# Patient Record
Sex: Male | Born: 1955 | Race: White | Hispanic: No | Marital: Married | Smoking: Current every day smoker
Health system: Southern US, Community
[De-identification: ages and names within clinical notes are randomized; demographics above are authoritative.]

## PROBLEM LIST (undated history)

## (undated) DIAGNOSIS — Z8619 Personal history of other infectious and parasitic diseases: Secondary | ICD-10-CM

## (undated) DIAGNOSIS — J449 Chronic obstructive pulmonary disease, unspecified: Secondary | ICD-10-CM

## (undated) HISTORY — PX: TONSILLECTOMY AND ADENOIDECTOMY: SHX28

## (undated) HISTORY — DX: Personal history of other infectious and parasitic diseases: Z86.19

---

## 1980-08-19 HISTORY — PX: EYE SURGERY: SHX253

## 1998-08-19 HISTORY — PX: NASAL SEPTUM SURGERY: SHX37

## 2005-11-27 ENCOUNTER — Emergency Department: Payer: Self-pay | Admitting: Emergency Medicine

## 2005-11-27 ENCOUNTER — Other Ambulatory Visit: Payer: Self-pay

## 2007-10-20 DIAGNOSIS — F17210 Nicotine dependence, cigarettes, uncomplicated: Secondary | ICD-10-CM | POA: Insufficient documentation

## 2008-09-15 DIAGNOSIS — E881 Lipodystrophy, not elsewhere classified: Secondary | ICD-10-CM | POA: Insufficient documentation

## 2008-09-17 DIAGNOSIS — Z8249 Family history of ischemic heart disease and other diseases of the circulatory system: Secondary | ICD-10-CM | POA: Insufficient documentation

## 2009-05-27 ENCOUNTER — Emergency Department: Payer: Self-pay | Admitting: Emergency Medicine

## 2009-06-13 ENCOUNTER — Emergency Department: Payer: Self-pay | Admitting: Emergency Medicine

## 2009-06-14 DIAGNOSIS — R55 Syncope and collapse: Secondary | ICD-10-CM | POA: Insufficient documentation

## 2009-09-25 DIAGNOSIS — R195 Other fecal abnormalities: Secondary | ICD-10-CM | POA: Insufficient documentation

## 2013-12-13 LAB — LIPID PANEL
Cholesterol: 172 mg/dL (ref 0–200)
HDL: 32 mg/dL — AB (ref 35–70)
LDL Cholesterol: 106 mg/dL
Triglycerides: 169 mg/dL — AB (ref 40–160)

## 2013-12-13 LAB — BASIC METABOLIC PANEL
BUN: 13 mg/dL (ref 4–21)
Creatinine: 1.2 mg/dL (ref 0.6–1.3)
Glucose: 99 mg/dL
Potassium: 4.4 mmol/L (ref 3.4–5.3)
Sodium: 141 mmol/L (ref 137–147)

## 2013-12-13 LAB — HEPATIC FUNCTION PANEL
ALT: 20 U/L (ref 10–40)
AST: 18 U/L (ref 14–40)

## 2013-12-13 LAB — PSA: PSA: 0.5

## 2015-01-28 ENCOUNTER — Other Ambulatory Visit: Payer: Self-pay | Admitting: Family Medicine

## 2015-01-28 NOTE — Telephone Encounter (Signed)
.  NOVS

## 2015-06-14 ENCOUNTER — Ambulatory Visit (INDEPENDENT_AMBULATORY_CARE_PROVIDER_SITE_OTHER): Payer: BLUE CROSS/BLUE SHIELD | Admitting: Family Medicine

## 2015-06-14 VITALS — BP 110/70 | HR 81 | Temp 98.7°F | Resp 16 | Ht 69.0 in | Wt 232.0 lb

## 2015-06-14 DIAGNOSIS — F17200 Nicotine dependence, unspecified, uncomplicated: Secondary | ICD-10-CM | POA: Diagnosis not present

## 2015-06-14 DIAGNOSIS — E881 Lipodystrophy, not elsewhere classified: Secondary | ICD-10-CM | POA: Diagnosis not present

## 2015-06-14 DIAGNOSIS — J342 Deviated nasal septum: Secondary | ICD-10-CM | POA: Insufficient documentation

## 2015-06-14 DIAGNOSIS — H5440 Blindness, one eye, unspecified eye: Secondary | ICD-10-CM | POA: Insufficient documentation

## 2015-06-14 DIAGNOSIS — Z0000 Encounter for general adult medical examination without abnormal findings: Secondary | ICD-10-CM

## 2015-06-14 MED ORDER — BUPROPION HCL ER (SMOKING DET) 150 MG PO TB12: ORAL_TABLET | ORAL | Status: DC

## 2015-06-14 NOTE — Progress Notes (Signed)
Patient: Jeffrey Scott, Male    DOB: [birth year], 59 y.o.   MRN: [id number] Visit Date: 06/14/2015  Today's Provider: Mila Merry, MD   Chief Complaint  Patient presents with  . Annual Exam  . Hyperlipidemia   Subjective:    Annual physical exam Jeffrey Scott is a 59 y.o. male who presents today for health maintenance and complete physical. He feels well. He reports exercising yes. He reports he is sleeping well.  -----------------------------------------------------------------    Lipid/Cholesterol, Follow-up:   Last seen for this 12/13/2013.  Management changes since that visit include none. . Last Lipid Panel:    Component Value Date/Time   CHOL 172 12/13/2013   TRIG 169* 12/13/2013   HDL 32* 12/13/2013   LDLCALC 106 12/13/2013    Risk factors for vascular disease include n/a  He reports good compliance with treatment. He is not having side effects.  Current symptoms include none and have been unchanged. Weight trend: stable Prior visit with dietician: no Current diet: well balanced Current exercise: walking  Wt Readings from Last 3 Encounters:  06/14/15 232 lb (105.235 kg)  12/13/13 230 lb (104.327 kg)    ------------------------------------------------------------------- Tobacco abuse: Has been trying without success to cut back on smoking. He did try Chantix i the past which he did not tolerate. He is interested in trying another medication.   Review of Systems  Constitutional: Negative.   HENT: Positive for congestion and tinnitus.   Eyes: Negative.   Respiratory: Negative.   Cardiovascular: Negative.   Gastrointestinal: Negative.   Endocrine: Negative.   Genitourinary: Negative.   Musculoskeletal: Negative.   Skin: Negative.   Allergic/Immunologic: Negative.   Neurological: Negative.   Hematological: Negative.   Psychiatric/Behavioral: Negative.     Social History He  reports that he has been smoking Cigarettes.  He  started smoking about 36 years ago. He has a 27 pack-year smoking history. He does not have any smokeless tobacco history on file. He reports that he drinks alcohol. He reports that he does not use illicit drugs. Social History   Social History  . Marital Status: Married    Spouse Name: N/A  . Number of Children: 2  . Years of Education: N/A   Occupational History  . Theatre stage manager    Social History Main Topics  . Smoking status: Current Every Day Smoker -- 0.75 packs/day for 36 years    Types: Cigarettes    Start date: 08/19/1978  . Smokeless tobacco: Not on file  . Alcohol Use: 0.0 oz/week    0 Standard drinks or equivalent per week     Comment: 1 beer each week  . Drug Use: No  . Sexual Activity: Not on file   Other Topics Concern  . Not on file   Social History Narrative  . No narrative on file    Patient Active Problem List   Diagnosis Date Noted  . 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 Deviated nasal septum 06/14/2015  . Abnormal findings in stool 09/25/2009  . Fam hx-ischem heart disease 09/17/2008  . 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 Compulsive tobacco user syndrome 10/20/2007    Past Surgical History  Procedure Laterality Date  . Nasal septum surgery  2000    [facility]  . Tonsillectomy and adenoidectomy    . Eye surgery Left 1982    for Diplopia    Family History  Family Status  Relation Status Death Age  . Mother Alive   .  Father Alive   . Brother Alive    His family history includes CAD in his mother; Dementia in his father; Diabetes in his father and mother; Hypertension in his father.    Allergies  Allergen Reactions  . Niaspan  [Niacin Er]   . Zyban  [Bupropion Hcl]   . Zyrtec  [Cetirizine]     malaise and fatigue    Previous Medications   ASPIRIN 81 MG TABLET    Take 1 tablet by mouth daily.   LOVASTATIN (MEVACOR) 20 MG TABLET    TAKE 1 TABLET BY MOUTH EVERY DAY   OMEGA-3 FATTY ACIDS (FISH OIL) 1000 MG CAPS    Take 1 capsule by mouth 2  (two) times daily.   OXYMETAZOLINE HCL-MENTHOL (AFRIN MENTHOL SPRAY) 0.05 % SOLN    as needed.    Patient Care Team: Malva Limes, MD as PCP - General (Family Medicine)     Objective:   Vitals: BP 110/70 mmHg  Pulse 81  Temp(Src) 98.7 F (37.1 C) (Oral)  Resp 16  Ht  (1.753 m)  Wt 232 lb (105.235 kg)  BMI 34.24 kg/m2  SpO2 94%   Physical Exam   General Appearance:    Alert, cooperative, no distress, appears stated age, obese  Head:    Normocephalic, without obvious abnormality, atraumatic  Eyes:    PERRL, conjunctiva/corneas clear, EOM's intact, fundi    benign, both eyes       Ears:    Normal TM's and external ear canals, both ears  Nose:   Nares normal, septum midline, mucosa normal, no drainage   or sinus tenderness  Throat:   Lips, mucosa, and tongue normal; teeth and gums normal  Neck:   Supple, symmetrical, trachea midline, no adenopathy;       thyroid:  No enlargement/tenderness/nodules; no carotid   bruit or JVD  Back:     Symmetric, no curvature, ROM normal, no CVA tenderness  Lungs:     Clear to auscultation bilaterally, respirations unlabored  Chest wall:    No tenderness or deformity  Heart:    Regular rate and rhythm, S1 and S2 normal, no murmur, rub   or gallop  Abdomen:     Soft, non-tender, bowel sounds active all four quadrants,    no masses, no organomegaly  Genitalia:    deferred  Rectal:    deferred  Extremities:   Extremities normal, atraumatic, no cyanosis or edema  Pulses:   2+ and symmetric all extremities  Skin:   Skin color, texture, turgor normal, no rashes or lesions  Lymph nodes:   Cervical, supraclavicular, and axillary nodes normal  Neurologic:   CNII-XII intact. Normal strength, sensation and reflexes      throughout    Depression Screen PHQ 2/9 Scores 06/14/2015  PHQ - 2 Score 0  PHQ- 9 Score 0      Assessment & Plan:     Routine Health Maintenance and Physical Exam  Exercise Activities and Dietary  recommendations Goals    None      Immunization History  Administered Date(s) Administered  . Influenza-Unspecified 04/20/2015  . Tdap 11/14/2010    Health Maintenance  Topic Date Due  . Hepatitis C Screening  Oct 22, 1955  . HIV Screening  06/29/1970  . TETANUS/TDAP  06/29/1974  . COLONOSCOPY  06/29/2005  . INFLUENZA VACCINE  03/20/2015      Discussed health benefits of physical activity, and encouraged him to engage in regular exercise appropriate for his age  and condition.    --------------------------------------------------------------------  1. Annual physical exam Unremarkable exam. Advised he needs colonoscopy. He refused referral but said he would call GI himself to schedule. I recommend he contact Dr. Annabell SabalWohl's office.  - PSA - Comprehensive metabolic panel  2. Lipodystrophy He is tolerating lovastatin well with no adverse effects.   - Lipid panel  3. Compulsive tobacco user syndrome Counseled on benefits of smoking. Given written information regarding recommendation for LDCT for lung cancer screening and advised to call to schedule if he decides to have this done.  - buPROPion (ZYBAN) 150 MG 12 hr tablet; One tablet every morning for 7 days, then one tablet twice daily. Stop smoking 14 days after starting medication  Dispense: 50 tablet; Refill: 3

## 2015-06-14 NOTE — Patient Instructions (Signed)
You are due for colonoscopy. I recommend [facility].

## 2015-06-15 ENCOUNTER — Other Ambulatory Visit: Payer: Self-pay

## 2015-06-15 ENCOUNTER — Telehealth: Payer: Self-pay

## 2015-06-15 ENCOUNTER — Other Ambulatory Visit: Payer: Self-pay | Admitting: Family Medicine

## 2015-06-15 LAB — COMPREHENSIVE METABOLIC PANEL
ALT: 26 IU/L (ref 0–44)
AST: 18 IU/L (ref 0–40)
Albumin/Globulin Ratio: 2 (ref 1.1–2.5)
Albumin: 4.7 g/dL (ref 3.5–5.5)
Alkaline Phosphatase: 70 IU/L (ref 39–117)
BUN/Creatinine Ratio: 11 (ref 9–20)
BUN: 14 mg/dL (ref 6–24)
Bilirubin Total: 0.4 mg/dL (ref 0.0–1.2)
CO2: 24 mmol/L (ref 18–29)
Calcium: 9.6 mg/dL (ref 8.7–10.2)
Chloride: 100 mmol/L (ref 97–106)
Creatinine, Ser: 1.33 mg/dL — ABNORMAL HIGH (ref 0.76–1.27)
GFR calc Af Amer: 67 mL/min/{1.73_m2} (ref >59)
GFR calc non Af Amer: 58 mL/min/{1.73_m2} — ABNORMAL LOW (ref >59)
Globulin, Total: 2.3 g/dL (ref 1.5–4.5)
Glucose: 96 mg/dL (ref 65–99)
Potassium: 4.6 mmol/L (ref 3.5–5.2)
Sodium: 140 mmol/L (ref 136–144)
Total Protein: 7 g/dL (ref 6.0–8.5)

## 2015-06-15 LAB — LIPID PANEL
Chol/HDL Ratio: 5.8 ratio units — ABNORMAL HIGH (ref 0.0–5.0)
Cholesterol, Total: 161 mg/dL (ref 100–199)
HDL: 28 mg/dL — ABNORMAL LOW (ref >39)
LDL Calculated: 98 mg/dL (ref 0–99)
Triglycerides: 173 mg/dL — ABNORMAL HIGH (ref 0–149)
VLDL Cholesterol Cal: 35 mg/dL (ref 5–40)

## 2015-06-15 LAB — PSA: Prostate Specific Ag, Serum: 0.5 ng/mL (ref 0.0–4.0)

## 2015-06-15 MED ORDER — LOVASTATIN 20 MG PO TABS: 20.0000 mg | ORAL_TABLET | Freq: Every day | ORAL | Status: DC

## 2015-06-15 NOTE — Telephone Encounter (Signed)
Patient was advised he is requesting that his script for Lovastatin be called into pharmacy. KW

## 2015-06-15 NOTE — Telephone Encounter (Signed)
[id number] 

## 2015-06-15 NOTE — Telephone Encounter (Signed)
Please see below-aa 

## 2015-06-15 NOTE — Telephone Encounter (Signed)
-----   Message from Malva Limesonald E Fisher, MD sent at 06/15/2015  8:03 AM EDT ----- PSA, blood sugar, kidney functions, electrolytes. Cholesterol pretty well controled at 161. Continue current medications. Check labs yearly.

## 2016-09-11 ENCOUNTER — Other Ambulatory Visit: Payer: Self-pay | Admitting: Family Medicine

## 2017-01-11 ENCOUNTER — Other Ambulatory Visit: Payer: Self-pay | Admitting: Family Medicine

## 2017-02-19 ENCOUNTER — Other Ambulatory Visit: Payer: Self-pay | Admitting: Family Medicine

## 2017-03-03 ENCOUNTER — Encounter: Payer: Self-pay | Admitting: Family Medicine

## 2017-03-03 ENCOUNTER — Ambulatory Visit (INDEPENDENT_AMBULATORY_CARE_PROVIDER_SITE_OTHER): Payer: BLUE CROSS/BLUE SHIELD | Admitting: Family Medicine

## 2017-03-03 VITALS — BP 132/78 | HR 80 | Temp 98.5°F | Resp 16 | Ht 70.0 in | Wt 234.5 lb

## 2017-03-03 DIAGNOSIS — Z1159 Encounter for screening for other viral diseases: Secondary | ICD-10-CM | POA: Diagnosis not present

## 2017-03-03 DIAGNOSIS — Z0000 Encounter for general adult medical examination without abnormal findings: Secondary | ICD-10-CM

## 2017-03-03 DIAGNOSIS — F17210 Nicotine dependence, cigarettes, uncomplicated: Secondary | ICD-10-CM

## 2017-03-03 DIAGNOSIS — E881 Lipodystrophy, not elsewhere classified: Secondary | ICD-10-CM | POA: Diagnosis not present

## 2017-03-03 DIAGNOSIS — R0609 Other forms of dyspnea: Secondary | ICD-10-CM | POA: Diagnosis not present

## 2017-03-03 DIAGNOSIS — J449 Chronic obstructive pulmonary disease, unspecified: Secondary | ICD-10-CM | POA: Insufficient documentation

## 2017-03-03 DIAGNOSIS — Z1211 Encounter for screening for malignant neoplasm of colon: Secondary | ICD-10-CM

## 2017-03-03 DIAGNOSIS — Z125 Encounter for screening for malignant neoplasm of prostate: Secondary | ICD-10-CM

## 2017-03-03 DIAGNOSIS — R0600 Dyspnea, unspecified: Secondary | ICD-10-CM

## 2017-03-03 DIAGNOSIS — Z6833 Body mass index (BMI) 33.0-33.9, adult: Secondary | ICD-10-CM | POA: Diagnosis not present

## 2017-03-03 MED ORDER — VARENICLINE TARTRATE 0.5 MG X 11 & 1 MG X 42 PO MISC: ORAL | 0 refills | Status: AC

## 2017-03-03 MED ORDER — VARENICLINE TARTRATE 1 MG PO TABS: 1.0000 mg | ORAL_TABLET | Freq: Two times a day (BID) | ORAL | 2 refills | Status: DC

## 2017-03-03 MED ORDER — TIOTROPIUM BROMIDE MONOHYDRATE 18 MCG IN CAPS: ORAL_CAPSULE | RESPIRATORY_TRACT | 12 refills | Status: DC

## 2017-03-03 NOTE — Progress Notes (Signed)
Patient: Jeffrey Scott, Male    DOB: [birth year], 61 y.o.   MRN: [id number] Visit Date: 03/03/2017  Today's Provider: Mila Merryonald Fisher, MD   Chief Complaint  Patient presents with  . Annual Exam  . Arm Pain  . Hyperlipidemia   Subjective:    Annual physical exam Jeffrey Scott is a 61 y.o. male who presents today for health maintenance and complete physical. He feels well. He reports exercising not regularly, but he does stay active. He reports he is sleeping well.  Colonoscopy- never Tdap- 11/14/2010.   Lipid/Cholesterol, Follow-up:   Last seen for this2 years ago.  Management changes since that visit include no changes. . Last Lipid Panel:    Component Value Date/Time   CHOL 161 06/14/2015 1059   TRIG 173 (H) 06/14/2015 1059   HDL 28 (L) 06/14/2015 1059   CHOLHDL 5.8 (H) 06/14/2015 1059   LDLCALC 98 06/14/2015 1059    Risk factors for vascular disease include smoking  He reports fair compliance with treatment. He is not having side effects.  Current symptoms include none and have been stable. Weight trend: stable Prior visit with dietician: no Current diet: well balanced Current exercise: no regular exercise  Wt Readings from Last 3 Encounters:  03/03/17 234 lb 8 oz (106.4 kg)  06/14/15 232 lb (105.2 kg)  12/13/13 230 lb (104.3 kg)    Arm pain: Patient reports that he has had pain in his left bicep for at least 6-8 months. He denies any injuries. He states the pain is occasionally relieved with Aleve. He reports that the pain is becoming more persistent, it mainly bothers him in the morning and he thinks he is just sleeping on it.   He also complains that he gets short of breath with exertion. Usually prolonged walking or going up more than two flights of stairs. No coughing or chest pain. He wants to stop smoking and states chantix worked well for him in the past.     Review of Systems  Constitutional: Negative.   HENT: Positive for  congestion, dental problem, postnasal drip and sinus pressure.   Eyes: Negative.   Respiratory: Positive for cough.   Cardiovascular: Negative.   Gastrointestinal: Negative.   Endocrine: Negative.   Genitourinary: Negative.   Musculoskeletal: Positive for arthralgias, back pain and myalgias.  Skin: Negative.   Allergic/Immunologic: Negative.   Neurological: Negative.   Hematological: Negative.   Psychiatric/Behavioral: Negative.     Social History      He  reports that he has been smoking Cigarettes.  He started smoking about 38 years ago. He has a 27.00 pack-year smoking history. He has never used smokeless tobacco. He reports that he drinks alcohol. He reports that he does not use drugs.       Social History   Social History  . Marital status: Married    Spouse name: N/A  . Number of children: 2  . Years of education: N/A   Occupational History  . Theatre stage managerlectrician and Plumber    Social History Main Topics  . Smoking status: Current Every Day Smoker    Packs/day: 0.75    Years: 50.00    Types: Cigarettes    Start date: 08/20/1967  . Smokeless tobacco: Never Used     Comment: smoking up to 1 ppd since age 61  . Alcohol use 0.0 oz/week     Comment: 1 beer each week  . Drug use: No  . Sexual activity:  Not Asked   Other Topics Concern  . None   Social History Narrative  . None    Past Medical History:  Diagnosis Date  . History of chicken pox   . History of measles      Patient Active Problem List   Diagnosis Date Noted  . 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 Deviated nasal septum 06/14/2015  . Abnormal findings in stool 09/25/2009  . Fam hx-ischem heart disease 09/17/2008  . 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 Compulsive tobacco user syndrome 10/20/2007    Past Surgical History:  Procedure Laterality Date  . EYE SURGERY Left 1982   for Diplopia  . NASAL SEPTUM SURGERY  2000   [facility]  . TONSILLECTOMY AND ADENOIDECTOMY      Family History        Family Status    Relation Status  . Mother Alive  . Father Deceased  . Brother Alive        His family history includes CAD in his mother; Dementia in his father; Diabetes in his father and mother; Hypertension in his father.     Allergies  Allergen Reactions  . Niaspan  [Niacin Er]   . Zyban  [Bupropion Hcl]   . Zyrtec  [Cetirizine]     malaise and fatigue     Current Outpatient Prescriptions:  .  aspirin 81 MG tablet, Take 1 tablet by mouth daily., Disp: , Rfl:  .  lovastatin (MEVACOR) 20 MG tablet, TAKE 1 TABLET BY MOUTH EVERY DAY, Disp: 30 tablet, Rfl: 2 .  Oxymetazoline HCl-Menthol (AFRIN MENTHOL SPRAY) 0.05 % SOLN, as needed., Disp: , Rfl:  .  buPROPion (ZYBAN) 150 MG 12 hr tablet, One tablet every morning for 7 days, then one tablet twice daily. Stop smoking 14 days after starting medication (Patient not taking: Reported on 03/03/2017), Disp: 50 tablet, Rfl: 3 .  Omega-3 Fatty Acids (FISH OIL) 1000 MG CAPS, Take 1 capsule by mouth 2 (two) times daily., Disp: , Rfl:    Patient Care Team: Malva Limes, MD as PCP - General (Family Medicine)      Objective:   Vitals: BP 132/78 (BP Location: Right Arm, Patient Position: Sitting, Cuff Size: Large)   Pulse 80   Temp 98.5 F (36.9 C)   Resp 16   Ht 5\' 10"  (1.778 m)   Wt 234 lb 8 oz (106.4 kg)   BMI 33.65 kg/m    Vitals:   03/03/17 1006  BP: 132/78  Pulse: 80  Resp: 16  Temp: 98.5 F (36.9 C)  Weight: 234 lb 8 oz (106.4 kg)  Height: 5\' 10"  (1.778 m)     Physical Exam   General Appearance:    Alert, cooperative, no distress, appears stated age, overweight  Head:    Normocephalic, without obvious abnormality, atraumatic  Eyes:    PERRL, conjunctiva/corneas clear, EOM's intact, fundi    benign, both eyes       Ears:    Normal TM's and external ear canals, both ears  Nose:   Nares normal, septum midline, mucosa normal, no drainage   or sinus tenderness  Throat:   Lips, mucosa, and tongue normal; teeth and gums normal  Neck:    Supple, symmetrical, trachea midline, no adenopathy;       thyroid:  No enlargement/tenderness/nodules; no carotid   bruit or JVD  Back:     Symmetric, no curvature, ROM normal, no CVA tenderness  Lungs:     Clear to auscultation bilaterally,  respirations unlabored  Chest wall:    No tenderness or deformity  Heart:    Regular rate and rhythm, S1 and S2 normal, no murmur, rub   or gallop  Abdomen:     Soft, non-tender, bowel sounds active all four quadrants,    no masses, no organomegaly  Genitalia:    deferred  Rectal:    deferred  Extremities:   Extremities normal, atraumatic, no cyanosis or edema  Pulses:   2+ and symmetric all extremities  Skin:   Skin color, texture, turgor normal, no rashes or lesions  Lymph nodes:   Cervical, supraclavicular, and axillary nodes normal  Neurologic:   CNII-XII intact. Normal strength, sensation and reflexes      throughout    Depression Screen PHQ 2/9 Scores 03/03/2017 06/14/2015  PHQ - 2 Score 0 0  PHQ- 9 Score 0 0      Assessment & Plan:     Routine Health Maintenance and Physical Exam  Exercise Activities and Dietary recommendations Goals    None      Immunization History  Administered Date(s) Administered  . Influenza-Unspecified 04/20/2015  . Tdap 11/14/2010    Health Maintenance  Topic Date Due  . Hepatitis C Screening  06-Oct-1955  . HIV Screening  06/29/1970  . COLONOSCOPY  06/29/2005  . INFLUENZA VACCINE  03/19/2017  . TETANUS/TDAP  11/13/2020     Discussed health benefits of physical activity, and encouraged him to engage in regular exercise appropriate for his age and condition.     1. Annual physical exam   2. BMI 33.0-33.9,adult Diet and exercise  3. Need for hepatitis C screening test  - Hepatitis C antibody  4. Lipodystrophy He is tolerating lovastatin well with no adverse effects.   - Comprehensive metabolic panel - Lipid panel  5. Smoking greater than 30 pack years  - varenicline (CHANTIX  STARTING MONTH PAK) 0.5 MG X 11 & 1 MG X 42 tablet; Take one 0.5 mg tablet daily for 3 days, then take one 0.5 mg tablet twice daily for 4 days, then take one 1 mg tablet twice daily  Dispense: 53 tablet; Refill: 0 - varenicline (CHANTIX CONTINUING MONTH PAK) 1 MG tablet; Take 1 tablet (1 mg total) by mouth 2 (two) times daily.  Dispense: 60 tablet; Refill: 2 - Ambulatory Referral for Lung Cancer Scre  6. Dyspnea on exertion  - Spirometry with Graph  7. Colon cancer screening  - Ambulatory referral to Gastroenterology  8. Prostate cancer screening  - PSA  9. Chronic obstructive pulmonary disease, unspecified COPD type (HCC) (mild on spirometry today) - tiotropium (SPIRIVA HANDIHALER) 18 MCG inhalation capsule; One capsule inhaler through device once a day  Dispense: 30 capsule; Refill: 12    Mila Merry, MD  [facility]

## 2017-03-03 NOTE — Patient Instructions (Signed)

## 2017-03-04 ENCOUNTER — Telehealth: Payer: Self-pay | Admitting: *Deleted

## 2017-03-04 LAB — COMPREHENSIVE METABOLIC PANEL
ALT: 18 IU/L (ref 0–44)
AST: 17 IU/L (ref 0–40)
Albumin/Globulin Ratio: 1.9 (ref 1.2–2.2)
Albumin: 4.3 g/dL (ref 3.6–4.8)
Alkaline Phosphatase: 62 IU/L (ref 39–117)
BUN/Creatinine Ratio: 9 — ABNORMAL LOW (ref 10–24)
BUN: 11 mg/dL (ref 8–27)
Bilirubin Total: 0.4 mg/dL (ref 0.0–1.2)
CO2: 23 mmol/L (ref 20–29)
Calcium: 9.3 mg/dL (ref 8.6–10.2)
Chloride: 106 mmol/L (ref 96–106)
Creatinine, Ser: 1.29 mg/dL — ABNORMAL HIGH (ref 0.76–1.27)
GFR calc Af Amer: 69 mL/min/{1.73_m2} (ref >59)
GFR calc non Af Amer: 59 mL/min/{1.73_m2} — ABNORMAL LOW (ref >59)
Globulin, Total: 2.3 g/dL (ref 1.5–4.5)
Glucose: 103 mg/dL — ABNORMAL HIGH (ref 65–99)
Potassium: 4.6 mmol/L (ref 3.5–5.2)
Sodium: 145 mmol/L — ABNORMAL HIGH (ref 134–144)
Total Protein: 6.6 g/dL (ref 6.0–8.5)

## 2017-03-04 LAB — LIPID PANEL
Chol/HDL Ratio: 5.5 ratio — ABNORMAL HIGH (ref 0.0–5.0)
Cholesterol, Total: 180 mg/dL (ref 100–199)
HDL: 33 mg/dL — ABNORMAL LOW (ref >39)
LDL Calculated: 114 mg/dL — ABNORMAL HIGH (ref 0–99)
Triglycerides: 163 mg/dL — ABNORMAL HIGH (ref 0–149)
VLDL Cholesterol Cal: 33 mg/dL (ref 5–40)

## 2017-03-04 LAB — PSA: Prostate Specific Ag, Serum: 0.6 ng/mL (ref 0.0–4.0)

## 2017-03-04 LAB — HEPATITIS C ANTIBODY: Hep C Virus Ab: <0.1 s/co ratio (ref 0.0–0.9)

## 2017-03-04 MED ORDER — LOVASTATIN 40 MG PO TABS: 40.0000 mg | ORAL_TABLET | Freq: Every day | ORAL | 11 refills | Status: DC

## 2017-03-04 NOTE — Telephone Encounter (Signed)
Patient was notified of results. Expressed understanding. Rx sent to pharmacy. 

## 2017-03-04 NOTE — Telephone Encounter (Signed)
-----   Message from Malva Limesonald E Fisher, MD sent at 03/04/2017 10:36 AM EDT ----- LDL cholesterol is up to 114, need to get below 100. Rest of labs normal .increase lovastatin to 40mg  one a day, #30, rf x 11.  Patient would like copy of labs mailed to his home please.

## 2017-03-05 ENCOUNTER — Telehealth: Payer: Self-pay | Admitting: *Deleted

## 2017-03-05 DIAGNOSIS — Z87891 Personal history of nicotine dependence: Secondary | ICD-10-CM

## 2017-03-05 NOTE — Telephone Encounter (Signed)
Received referral for initial lung cancer screening scan. Contacted patient and obtained smoking history,(current, 37.5 pack year) as well as answering questions related to screening process. Patient denies signs of lung cancer such as weight loss or hemoptysis. Patient denies comorbidity that would prevent curative treatment if lung cancer were found. Patient is scheduled for shared decision making visit and CT scan on 03/18/17.

## 2017-03-18 ENCOUNTER — Ambulatory Visit
Admission: RE | Admit: 2017-03-18 | Discharge: 2017-03-18 | Disposition: A | Discharge status: Home / Self Care | Payer: BLUE CROSS/BLUE SHIELD | Source: Ambulatory Visit | Attending: Oncology | Admitting: Oncology

## 2017-03-18 ENCOUNTER — Inpatient Hospital Stay: Payer: BLUE CROSS/BLUE SHIELD | Attending: Oncology | Admitting: Oncology

## 2017-03-18 DIAGNOSIS — Z87891 Personal history of nicotine dependence: Secondary | ICD-10-CM | POA: Diagnosis not present

## 2017-03-18 DIAGNOSIS — F17210 Nicotine dependence, cigarettes, uncomplicated: Secondary | ICD-10-CM | POA: Diagnosis not present

## 2017-03-18 DIAGNOSIS — Z122 Encounter for screening for malignant neoplasm of respiratory organs: Secondary | ICD-10-CM | POA: Diagnosis not present

## 2017-03-18 DIAGNOSIS — J439 Emphysema, unspecified: Secondary | ICD-10-CM | POA: Diagnosis not present

## 2017-03-18 NOTE — Progress Notes (Signed)
In accordance with CMS guidelines, patient has met eligibility criteria including age, absence of signs or symptoms of lung cancer.  Social History  Substance Use Topics  . Smoking status: Current Every Day Smoker    Packs/day: 0.75    Years: 50.00    Types: Cigarettes    Start date: 08/20/1967  . Smokeless tobacco: Never Used     Comment: smoking up to 1 ppd since age 61  . Alcohol use 0.0 oz/week     Comment: 1 beer each week     A shared decision-making session was conducted prior to the performance of CT scan. This includes one or more decision aids, includes benefits and harms of screening, follow-up diagnostic testing, over-diagnosis, false positive rate, and total radiation exposure.  Counseling on the importance of adherence to annual lung cancer LDCT screening, impact of co-morbidities, and ability or willingness to undergo diagnosis and treatment is imperative for compliance of the program.  Counseling on the importance of continued smoking cessation for former smokers; the importance of smoking cessation for current smokers, and information about tobacco cessation interventions have been given to patient including [place] and [facility] quit [place] programs.  Written order for lung cancer screening with LDCT has been given to the patient and any and all questions have been answered to the best of my abilities.   Yearly follow up will be coordinated by Burgess Estelle, Thoracic Navigator.  Faythe Casa, NP 03/18/2017 2:10 PM

## 2017-03-21 ENCOUNTER — Encounter: Payer: Self-pay | Admitting: *Deleted

## 2017-06-27 ENCOUNTER — Other Ambulatory Visit: Payer: Self-pay | Admitting: Family Medicine

## 2017-06-27 DIAGNOSIS — F17210 Nicotine dependence, cigarettes, uncomplicated: Secondary | ICD-10-CM

## 2017-06-27 MED ORDER — VARENICLINE TARTRATE 1 MG PO TABS: 1.0000 mg | ORAL_TABLET | Freq: Two times a day (BID) | ORAL | 2 refills | Status: DC

## 2017-06-27 NOTE — Telephone Encounter (Signed)
Requesting 90 day supply.

## 2017-06-27 NOTE — Telephone Encounter (Signed)
[facility] pharmacy faxed a request 90-days supply for the following medication. Thanks CC  varenicline (CHANTIX CONTINUING MONTH PAK) 1 MG tablet

## 2017-07-01 ENCOUNTER — Other Ambulatory Visit: Payer: Self-pay | Admitting: Family Medicine

## 2017-07-01 DIAGNOSIS — F17210 Nicotine dependence, cigarettes, uncomplicated: Secondary | ICD-10-CM

## 2017-07-01 MED ORDER — VARENICLINE TARTRATE 1 MG PO TABS: ORAL_TABLET | ORAL | 1 refills | Status: DC

## 2017-07-01 NOTE — Telephone Encounter (Signed)
[facility] faxed a request for a 90-days supply for the following medication. Thanks CC  CHANTIX 1 MG tablet

## 2018-03-03 ENCOUNTER — Other Ambulatory Visit: Payer: Self-pay | Admitting: Family Medicine

## 2018-03-05 ENCOUNTER — Telehealth: Payer: Self-pay | Admitting: Nurse Practitioner

## 2018-03-12 NOTE — Progress Notes (Signed)
Patient: Jeffrey Scott, Male    DOB: 1956-04-05, 62 y.o.   MRN: [id number] Visit Date: 03/13/2018  Today's Provider: Mila Merryonald Jefferson Fullam, MD   Chief Complaint  Patient presents with  . Annual Exam   Subjective:    Annual physical exam Jeffrey Scott is a 62 y.o. male who presents today for health maintenance and complete physical. He feels well. He reports no regular exercising . He reports he is sleeping well.  -----------------------------------------------------------------   Lipid/Cholesterol, Follow-up:   Last seen for this 1 years ago.  Management since that visit includes; labs checked. Increased lovastatin to 40 mg qd.  Last Lipid Panel:    Component Value Date/Time   CHOL 180 03/03/2017 1106   TRIG 163 (H) 03/03/2017 1106   HDL 33 (L) 03/03/2017 1106   CHOLHDL 5.5 (H) 03/03/2017 1106   LDLCALC 114 (H) 03/03/2017 1106    He reports good compliance with treatment. He is not having side effects.   Wt Readings from Last 3 Encounters:  03/13/18 247 lb 6.4 oz (112.2 kg)  03/18/17 225 lb (102.1 kg)  03/03/17 234 lb 8 oz (106.4 kg)    ------------------------------------------------------------------------  Compulsive Tobacco user syndrome From 03/03/2017-given rx for CHANTIX. Patient is requesting a refill. Patient states he went from smoking a carton of cigarettes every 5 days to 3 packs per week. States he and his wife plan on stopping completely this year. He feels like chantix has worked well and wants to continue.   Chronic obstructive pulmonary disease, unspecified COPD type (HCC) From 03/03/2017-spirometry obtained. [facility] Patient states symptoms of SOB is unchanged with Spiriva.    Review of Systems  Constitutional: Negative.   HENT: Negative.   Eyes: Negative.   Respiratory: Positive for cough, shortness of breath and wheezing.   Cardiovascular: Negative.   Gastrointestinal: Negative.   Endocrine: Negative.   Genitourinary:  Negative.   Musculoskeletal: Positive for arthralgias.  Skin: Negative.   Allergic/Immunologic: Negative.   Neurological: Negative.   Hematological: Bruises/bleeds easily.  Psychiatric/Behavioral: Negative.     Social History      He  reports that he has been smoking cigarettes.  He started smoking about 50 years ago. He has a 37.50 pack-year smoking history. He has never used smokeless tobacco. He reports that he drinks alcohol. He reports that he does not use drugs.       Social History   Socioeconomic History  . Marital status: Married    Spouse name: Not on file  . Number of children: 2  . Years of education: Not on file  . Highest education level: Not on file  Occupational History  . Occupation: Theatre stage managerlectrician and Plumber  Social Needs  . Financial resource strain: Not on file  . Food insecurity:    Worry: Not on file    Inability: Not on file  . Transportation needs:    Medical: Not on file    Non-medical: Not on file  Tobacco Use  . Smoking status: Current Every Day Smoker    Packs/day: 0.75    Years: 50.00    Pack years: 37.50    Types: Cigarettes    Start date: 08/20/1967  . Smokeless tobacco: Never Used  . Tobacco comment: smoking up to 1 ppd since age 62  Substance and Sexual Activity  . Alcohol use: Yes    Alcohol/week: 0.0 oz    Comment: 1 beer each week  . Drug use: No  . Sexual  activity: Not on file  Lifestyle  . Physical activity:    Days per week: Not on file    Minutes per session: Not on file  . Stress: Not on file  Relationships  . Social connections:    Talks on phone: Not on file    Gets together: Not on file    Attends religious service: Not on file    Active member of club or organization: Not on file    Attends meetings of clubs or organizations: Not on file    Relationship status: Not on file  Other Topics Concern  . Not on file  Social History Narrative  . Not on file    Past Medical History:  Diagnosis Date  . History of chicken  pox   . History of measles      Patient Active Problem List   Diagnosis Date Noted  . Chronic obstructive pulmonary disease (HCC) 03/03/2017  . 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 Deviated nasal septum 06/14/2015  . Abnormal findings in stool 09/25/2009  . Fam hx-ischem heart disease 09/17/2008  . 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 Compulsive tobacco user syndrome 10/20/2007    Past Surgical History:  Procedure Laterality Date  . EYE SURGERY Left 1982   for Diplopia  . NASAL SEPTUM SURGERY  2000   [facility]  . TONSILLECTOMY AND ADENOIDECTOMY      Family History        Family Status  Relation Name Status  . Mother  Alive  . Father  Deceased  . Brother  Alive        His family history includes CAD in his mother; Dementia in his father; Diabetes in his father and mother; Hypertension in his father and mother.      Allergies  Allergen Reactions  . Niaspan  [Niacin Er]   . Zyban  [Bupropion Hcl]   . Zyrtec  [Cetirizine]     malaise and fatigue     Current Outpatient Medications:  .  aspirin 81 MG tablet, Take 1 tablet by mouth daily., Disp: , Rfl:  .  lovastatin (MEVACOR) 40 MG tablet, TAKE 1 TABLET BY MOUTH EVERYDAY AT BEDTIME, Disp: 90 tablet, Rfl: 0 .  Oxymetazoline HCl-Menthol (AFRIN MENTHOL SPRAY) 0.05 % SOLN, as needed., Disp: , Rfl:  .  tiotropium (SPIRIVA HANDIHALER) 18 MCG inhalation capsule, One capsule inhaler through device once a day, Disp: 30 capsule, Rfl: 12 .  varenicline (CHANTIX) 1 MG tablet, TAKE 1 TABLET (1 MG TOTAL) BY MOUTH 2 (TWO) TIMES DAILY., Disp: 180 tablet, Rfl: 1   Patient Care Team: Malva Limes, MD as PCP - General (Family Medicine)      Objective:   Vitals: BP 138/88 (BP Location: Left Arm, Patient Position: Sitting, Cuff Size: Large)   Pulse 81   Temp 98.2 F (36.8 C) (Oral)   Ht 5\' 10"  (1.778 m)   Wt 247 lb 6.4 oz (112.2 kg)   SpO2 98%   BMI 35.50 kg/m    Vitals:   03/13/18 0900  BP: 138/88  Pulse: 81  Temp: 98.2 F (36.8  C)  TempSrc: Oral  SpO2: 98%  Weight: 247 lb 6.4 oz (112.2 kg)  Height: 5\' 10"  (1.778 m)     Physical Exam   General Appearance:    Alert, cooperative, no distress, appears stated age  Head:    Normocephalic, without obvious abnormality, atraumatic  Eyes:    PERRL, conjunctiva/corneas clear, EOM's intact, fundi    benign, both  eyes       Ears:    Normal TM's and external ear canals, both ears  Nose:   Nares normal, septum midline, mucosa normal, no drainage   or sinus tenderness  Throat:   Lips, mucosa, and tongue normal; teeth and gums normal  Neck:   Supple, symmetrical, trachea midline, no adenopathy;       thyroid:  No enlargement/tenderness/nodules; no carotid   bruit or JVD  Back:     Symmetric, no curvature, ROM normal, no CVA tenderness  Lungs:     Clear to auscultation bilaterally, respirations unlabored  Chest wall:    No tenderness or deformity  Heart:    Regular rate and rhythm, S1 and S2 normal, no murmur, rub   or gallop  Abdomen:     Soft, non-tender, bowel sounds active all four quadrants,    no masses, no organomegaly  Genitalia:    deferred  Rectal:    deferred  Extremities:   Extremities normal, atraumatic, no cyanosis or edema  Pulses:   2+ and symmetric all extremities  Skin:   Skin color, texture, turgor normal, no rashes or lesions  Lymph nodes:   Cervical, supraclavicular, and axillary nodes normal  Neurologic:   CNII-XII intact. Normal strength, sensation and reflexes      throughout    Depression Screen PHQ 2/9 Scores 03/13/2018 03/03/2017 06/14/2015  PHQ - 2 Score 0 0 0  PHQ- 9 Score 0 0 0    Spirometry: moderate obstruction.   Assessment & Plan:     Routine Health Maintenance and Physical Exam  Exercise Activities and Dietary recommendations Goals    None      Immunization History  Administered Date(s) Administered  . Influenza-Unspecified 04/20/2015  . Tdap 11/14/2010    Health Maintenance  Topic Date Due  . HIV Screening   06/29/1970  . COLONOSCOPY  06/29/2005  . INFLUENZA VACCINE  03/19/2018  . TETANUS/TDAP  11/13/2020  . Hepatitis C Screening  Completed     Discussed health benefits of physical activity, and encouraged him to engage in regular exercise appropriate for his age and condition.    --------------------------------------------------------------------  1. Annual physical exam  - HIV antibody - Lipid Profile - Comprehensive Metabolic Panel (CMET) - CBC with Differential - TSH - PSA  2. Lipodystrophy He is tolerating lovastatin well with no adverse effects.   - Lipid Profile - Comprehensive Metabolic Panel (CMET) - TSH  3. Chronic obstructive pulmonary disease, unspecified COPD type (HCC) Worsening with further reduction of FVC and FEV1 on spirometry today. Counseled on importance of smoking cessation. Change Spiriva to Stiolto.   4. . Smoking greater than 30 pack years Down to 3 1/2 pack per week. Counseled to stop completely as soon as able, continue- varenicline (CHANTIX) 1 MG tablet; TAKE 1 TABLET (1 MG TOTAL) BY MOUTH 2 (TWO) TIMES DAILY.  Dispense: 180 tablet; Refill: 3 - Spirometry with Graph  5. Colon cancer screening He states he plans on having this done after his mother and wife have upcoming medical procedures done.   6. Prostate cancer screening -PSA  7. Dyspnea, unspecified type Secondary to COPD, change inhalers as above.  - CBC with Differential - Spirometry with Graph  8. Encounter for screening for HIV  - HIV antibody  9.. BMI 35.0-35.9,adult Walks 9-11 thousand steps a day for work. Cut back on portion sizes.  - TSH   Mila Merry, MD  [facility]

## 2018-03-13 ENCOUNTER — Encounter: Payer: Self-pay | Admitting: Family Medicine

## 2018-03-13 ENCOUNTER — Ambulatory Visit (INDEPENDENT_AMBULATORY_CARE_PROVIDER_SITE_OTHER): Payer: BLUE CROSS/BLUE SHIELD | Admitting: Family Medicine

## 2018-03-13 VITALS — BP 138/88 | HR 81 | Temp 98.2°F | Ht 70.0 in | Wt 247.4 lb

## 2018-03-13 DIAGNOSIS — Z114 Encounter for screening for human immunodeficiency virus [HIV]: Secondary | ICD-10-CM

## 2018-03-13 DIAGNOSIS — Z6835 Body mass index (BMI) 35.0-35.9, adult: Secondary | ICD-10-CM

## 2018-03-13 DIAGNOSIS — E881 Lipodystrophy, not elsewhere classified: Secondary | ICD-10-CM

## 2018-03-13 DIAGNOSIS — Z1211 Encounter for screening for malignant neoplasm of colon: Secondary | ICD-10-CM

## 2018-03-13 DIAGNOSIS — F17200 Nicotine dependence, unspecified, uncomplicated: Secondary | ICD-10-CM | POA: Diagnosis not present

## 2018-03-13 DIAGNOSIS — J449 Chronic obstructive pulmonary disease, unspecified: Secondary | ICD-10-CM | POA: Diagnosis not present

## 2018-03-13 DIAGNOSIS — R0600 Dyspnea, unspecified: Secondary | ICD-10-CM

## 2018-03-13 DIAGNOSIS — Z125 Encounter for screening for malignant neoplasm of prostate: Secondary | ICD-10-CM

## 2018-03-13 DIAGNOSIS — Z0000 Encounter for general adult medical examination without abnormal findings: Secondary | ICD-10-CM

## 2018-03-13 DIAGNOSIS — F17210 Nicotine dependence, cigarettes, uncomplicated: Secondary | ICD-10-CM

## 2018-03-13 DIAGNOSIS — Z0001 Encounter for general adult medical examination with abnormal findings: Secondary | ICD-10-CM

## 2018-03-13 MED ORDER — VARENICLINE TARTRATE 1 MG PO TABS: ORAL_TABLET | ORAL | 3 refills | Status: DC

## 2018-03-13 MED ORDER — TIOTROPIUM BROMIDE-OLODATEROL 2.5-2.5 MCG/ACT IN AERS: 2.0000 | INHALATION_SPRAY | Freq: Every day | RESPIRATORY_TRACT | 12 refills | Status: DC

## 2018-03-13 NOTE — Patient Instructions (Addendum)
   Stop smoking before the new year begins   Call for referral to GI for colonoscopy in the next couple of months.    Screening for lung cancer is recommended for people between 4255 and 880 years of age who have smoked the equivalent of 1 pack per day for 30 years. You are due for this screening in August 2019 and should get a call from [facility] to schedule.

## 2018-03-14 LAB — CBC WITH DIFFERENTIAL/PLATELET
Basophils Absolute: 0 10*3/uL (ref 0.0–0.2)
Basos: 0 %
EOS (ABSOLUTE): 0.2 10*3/uL (ref 0.0–0.4)
Eos: 2 %
Hematocrit: 41.5 % (ref 37.5–51.0)
Hemoglobin: 14.5 g/dL (ref 13.0–17.7)
Immature Grans (Abs): 0 10*3/uL (ref 0.0–0.1)
Immature Granulocytes: 0 %
Lymphocytes Absolute: 2.1 10*3/uL (ref 0.7–3.1)
Lymphs: 26 %
MCH: 30.9 pg (ref 26.6–33.0)
MCHC: 34.9 g/dL (ref 31.5–35.7)
MCV: 89 fL (ref 79–97)
Monocytes Absolute: 0.6 10*3/uL (ref 0.1–0.9)
Monocytes: 7 %
Neutrophils Absolute: 5.1 10*3/uL (ref 1.4–7.0)
Neutrophils: 65 %
Platelets: 210 10*3/uL (ref 150–450)
RBC: 4.69 x10E6/uL (ref 4.14–5.80)
RDW: 14.7 % (ref 12.3–15.4)
WBC: 8.1 10*3/uL (ref 3.4–10.8)

## 2018-03-14 LAB — COMPREHENSIVE METABOLIC PANEL
ALT: 35 IU/L (ref 0–44)
AST: 24 IU/L (ref 0–40)
Albumin/Globulin Ratio: 2.1 (ref 1.2–2.2)
Albumin: 4.5 g/dL (ref 3.6–4.8)
Alkaline Phosphatase: 55 IU/L (ref 39–117)
BUN/Creatinine Ratio: 13 (ref 10–24)
BUN: 18 mg/dL (ref 8–27)
Bilirubin Total: 0.4 mg/dL (ref 0.0–1.2)
CO2: 23 mmol/L (ref 20–29)
Calcium: 9.5 mg/dL (ref 8.6–10.2)
Chloride: 103 mmol/L (ref 96–106)
Creatinine, Ser: 1.42 mg/dL — ABNORMAL HIGH (ref 0.76–1.27)
GFR calc Af Amer: 61 mL/min/{1.73_m2} (ref >59)
GFR calc non Af Amer: 53 mL/min/{1.73_m2} — ABNORMAL LOW (ref >59)
Globulin, Total: 2.1 g/dL (ref 1.5–4.5)
Glucose: 106 mg/dL — ABNORMAL HIGH (ref 65–99)
Potassium: 4.3 mmol/L (ref 3.5–5.2)
Sodium: 140 mmol/L (ref 134–144)
Total Protein: 6.6 g/dL (ref 6.0–8.5)

## 2018-03-14 LAB — TSH: TSH: 1.23 u[IU]/mL (ref 0.450–4.500)

## 2018-03-14 LAB — HIV ANTIBODY (ROUTINE TESTING W REFLEX): HIV Screen 4th Generation wRfx: NONREACTIVE

## 2018-03-14 LAB — LIPID PANEL
Chol/HDL Ratio: 5.1 ratio — ABNORMAL HIGH (ref 0.0–5.0)
Cholesterol, Total: 162 mg/dL (ref 100–199)
HDL: 32 mg/dL — ABNORMAL LOW (ref >39)
LDL Calculated: 89 mg/dL (ref 0–99)
Triglycerides: 207 mg/dL — ABNORMAL HIGH (ref 0–149)
VLDL Cholesterol Cal: 41 mg/dL — ABNORMAL HIGH (ref 5–40)

## 2018-03-14 LAB — PSA: Prostate Specific Ag, Serum: 0.6 ng/mL (ref 0.0–4.0)

## 2018-03-16 ENCOUNTER — Telehealth: Payer: Self-pay

## 2018-03-16 NOTE — Telephone Encounter (Signed)
-----   Message from Malva Limesonald E Fisher, MD sent at 03/14/2018  6:31 PM EDT ----- Cholesterol is well controlled at 162 rest of labs are normal. Continue current medications.  Check yearly

## 2018-03-16 NOTE — Telephone Encounter (Signed)
Attempted to contact patient. No answer nor voicemail.  

## 2018-03-18 NOTE — Telephone Encounter (Signed)
Patient advised.

## 2018-04-02 ENCOUNTER — Other Ambulatory Visit: Payer: Self-pay | Admitting: Family Medicine

## 2018-04-02 DIAGNOSIS — J449 Chronic obstructive pulmonary disease, unspecified: Secondary | ICD-10-CM

## 2018-05-31 ENCOUNTER — Other Ambulatory Visit: Payer: Self-pay | Admitting: Family Medicine

## 2018-08-28 IMAGING — CT CT CHEST LUNG CANCER SCREENING LOW DOSE W/O CM
1 of 2 series · 15 of 40 positions shown, 19 images · non-contrast
Comparison: None.

CLINICAL DATA: 60-year-old male with day 38 pack-year history of
smoking. Lung cancer screening.

EXAM:
CT CHEST WITHOUT CONTRAST LOW-DOSE FOR LUNG CANCER SCREENING
TECHNIQUE: Multidetector CT imaging of the chest was performed following the
standard protocol without IV contrast.

[Series 2: axial st · axial · 0.79mm/px · z∈[-573,-288]mm · 15 of 63 slices shown, 19 images]
[im 3/63  mediastinal]
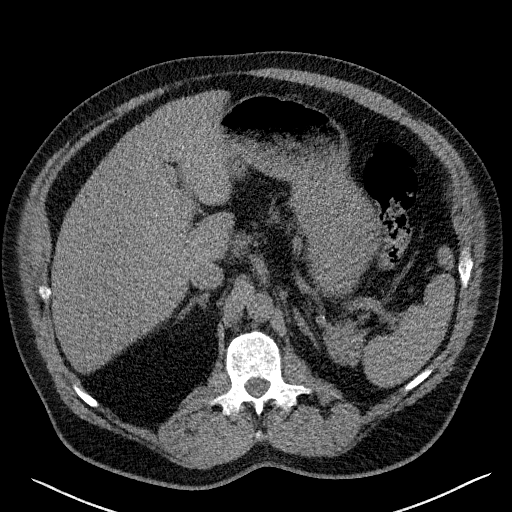
[im 3/63  lung]
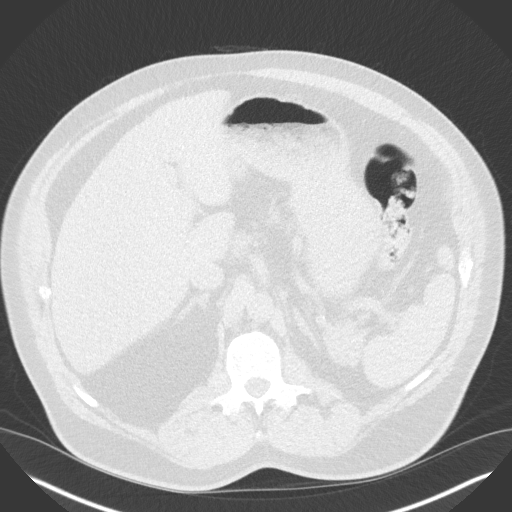
[im 8/63  lung]
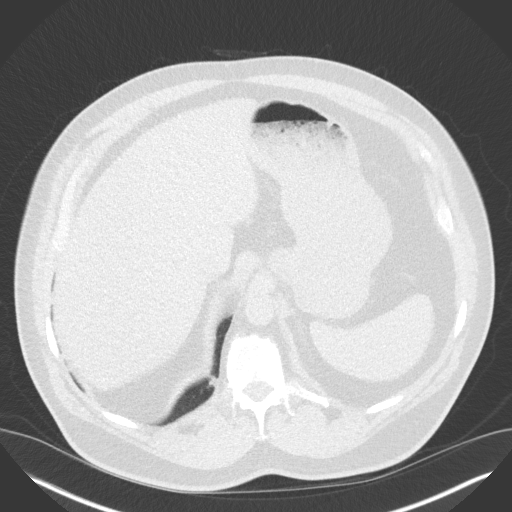
[im 12/63  lung]
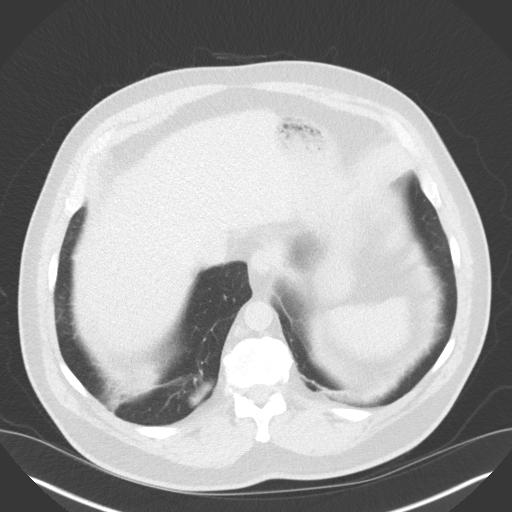
[im 16/63  lung]
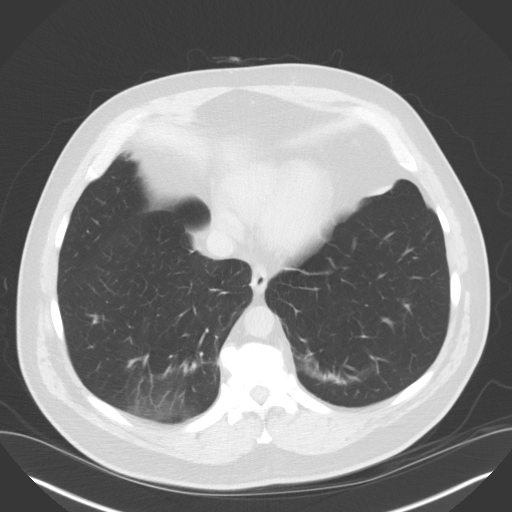
[im 20/63  mediastinal]
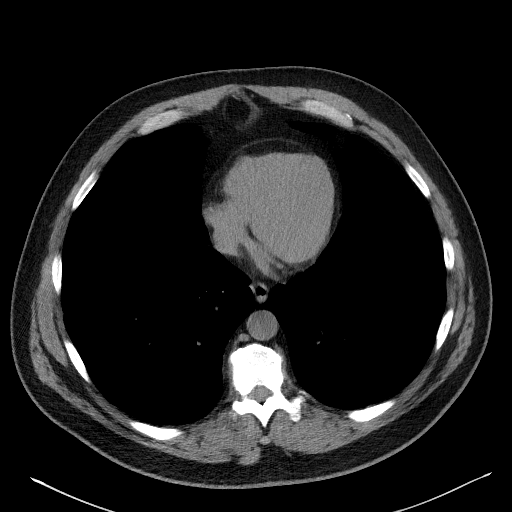
[im 20/63  lung]
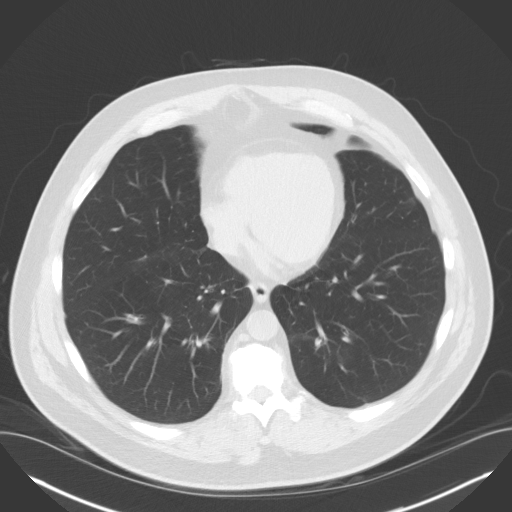
[im 24/63  lung]
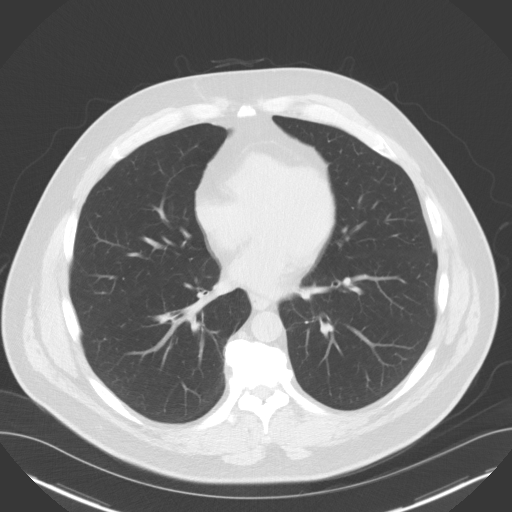
[im 29/63  lung]
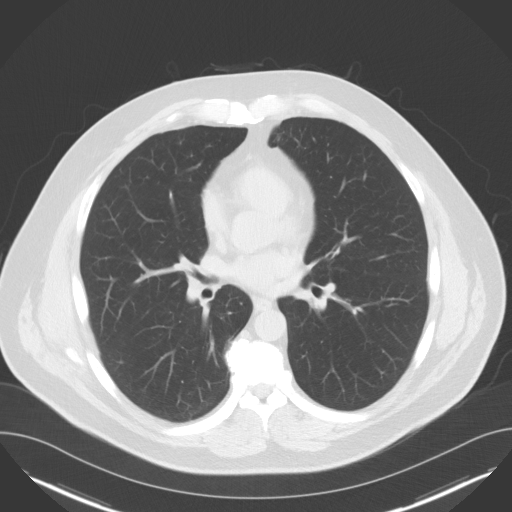
[im 32/63  lung]
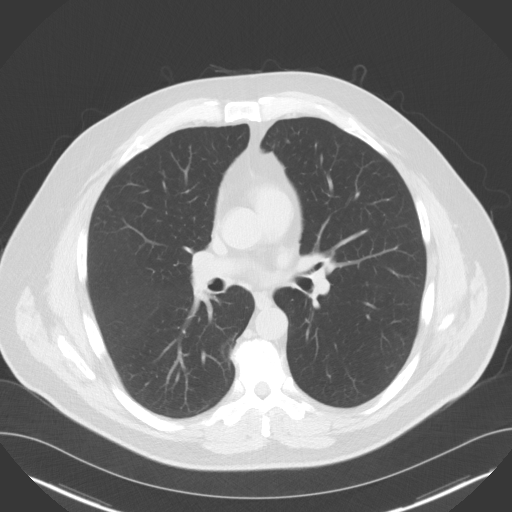
[im 34/63  mediastinal]
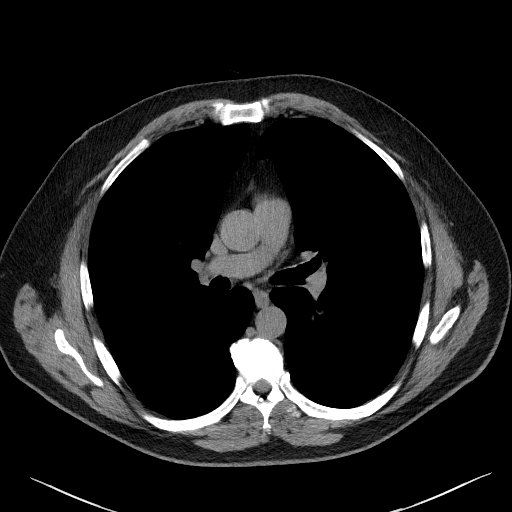
[im 34/63  lung]
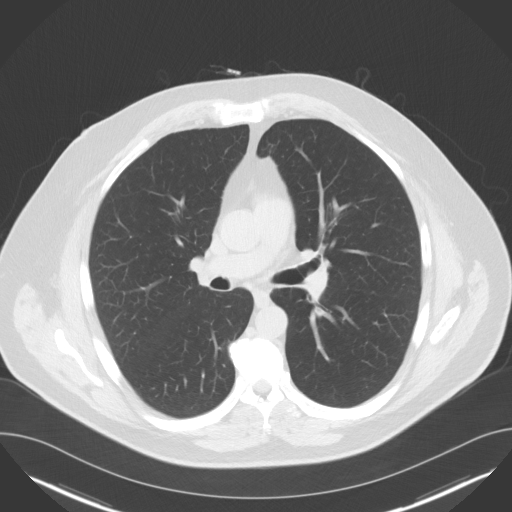
[im 39/63  lung]
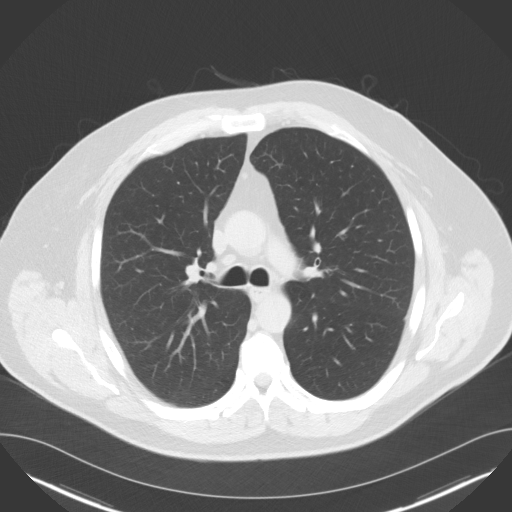
[im 43/63  lung]
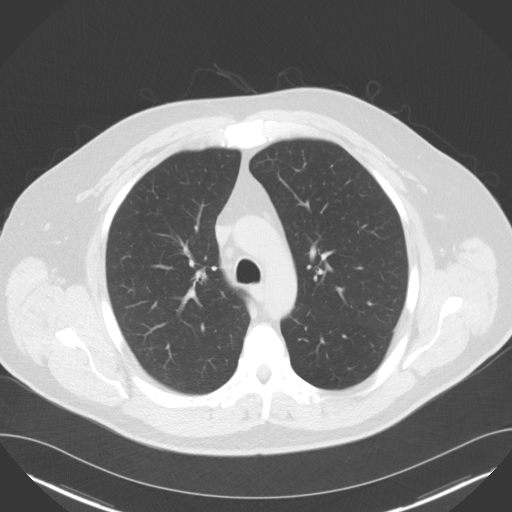
[im 47/63  lung]
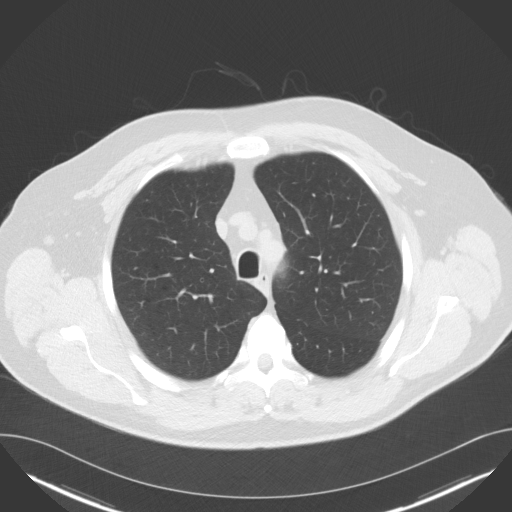
[im 51/63  mediastinal]
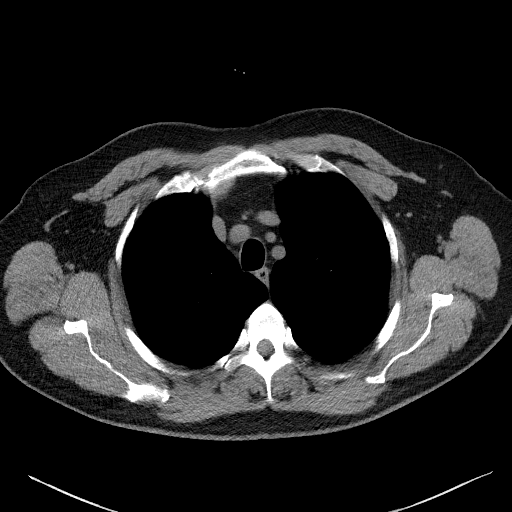
[im 51/63  lung]
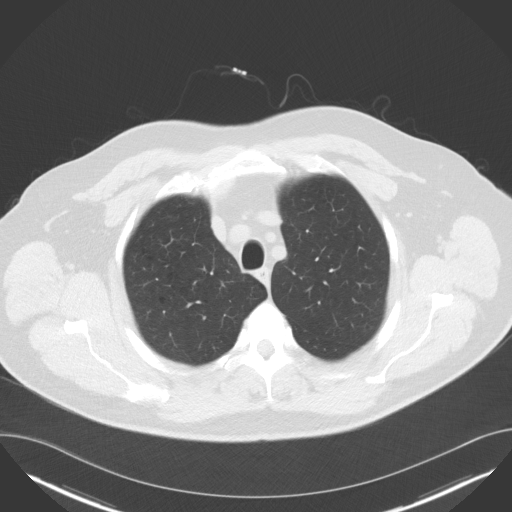
[im 55/63  lung]
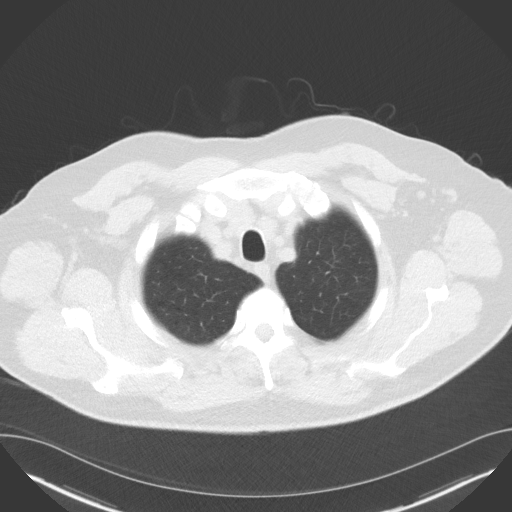
[im 60/63  lung]
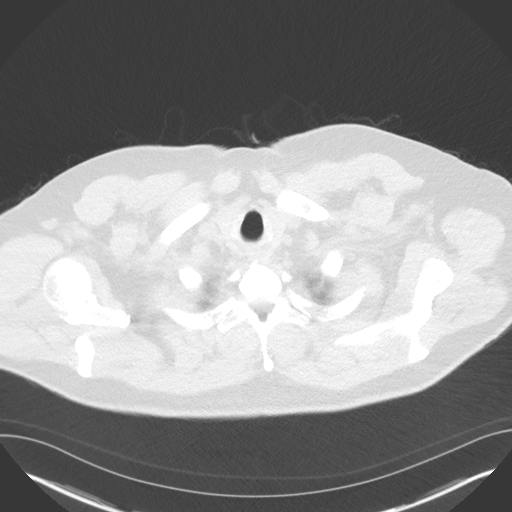

[15 of 40 positions shown; findings below may reference images not displayed]

FINDINGS: Cardiovascular: The heart size is normal. No pericardial effusion.
No thoracic aortic aneurysm.

Mediastinum/Nodes: No mediastinal lymphadenopathy. No evidence for
gross hilar lymphadenopathy although assessment is limited by the
lack of intravenous contrast on today's study. The esophagus has
normal imaging features. There is no axillary lymphadenopathy.

Lungs/Pleura: Centrilobular emphysema evident. Small nodule
identified central right lower lobe with volume derived mean
diameter of 5.2 mm. Subsegmental atelectasis or linear scarring
noted in both lung bases. No suspicious pulmonary nodule or mass. No
focal airspace consolidation. No pulmonary edema or pleural
effusion.

Upper Abdomen: Unremarkable.

Musculoskeletal: Bone windows reveal no worrisome lytic or sclerotic
osseous lesions.
IMPRESSION: 1. Lung-RADS 2, benign appearance or behavior. Continue annual
screening with low-dose chest CT without contrast in 12 months.
2.  Emphysema. ([id number])

## 2019-04-21 ENCOUNTER — Other Ambulatory Visit: Payer: Self-pay | Admitting: Family Medicine

## 2019-04-21 DIAGNOSIS — F17200 Nicotine dependence, unspecified, uncomplicated: Secondary | ICD-10-CM

## 2019-04-21 DIAGNOSIS — F17210 Nicotine dependence, cigarettes, uncomplicated: Secondary | ICD-10-CM

## 2019-07-05 ENCOUNTER — Other Ambulatory Visit: Payer: Self-pay | Admitting: Family Medicine

## 2019-07-05 NOTE — Telephone Encounter (Signed)
From [facility], please review 

## 2019-07-05 NOTE — Telephone Encounter (Signed)
Pt has appt 08/09/19 for a cpe, but is out of his   STIOLTO RESPIMAT 2.5-2.5 MCG/ACT AERS  Can you send in a month to get him through?  [place], [place] - [place] [phone] (Phone) [phone] (Fax)

## 2019-07-05 NOTE — Telephone Encounter (Signed)
Sending to office for manual review / Next appointment is 08/09/2019 for CPE

## 2019-07-06 MED ORDER — STIOLTO RESPIMAT 2.5-2.5 MCG/ACT IN AERS: 2.0000 | INHALATION_SPRAY | Freq: Every day | RESPIRATORY_TRACT | 12 refills | Status: DC

## 2019-08-09 ENCOUNTER — Encounter: Payer: Self-pay | Admitting: Family Medicine

## 2019-08-09 ENCOUNTER — Other Ambulatory Visit: Payer: Self-pay

## 2019-08-09 ENCOUNTER — Ambulatory Visit (INDEPENDENT_AMBULATORY_CARE_PROVIDER_SITE_OTHER): Payer: BLUE CROSS/BLUE SHIELD | Admitting: Family Medicine

## 2019-08-09 VITALS — BP 112/62 | HR 85 | Temp 97.1°F | Resp 18 | Ht 70.0 in | Wt 235.0 lb

## 2019-08-09 DIAGNOSIS — F17210 Nicotine dependence, cigarettes, uncomplicated: Secondary | ICD-10-CM | POA: Diagnosis not present

## 2019-08-09 DIAGNOSIS — J449 Chronic obstructive pulmonary disease, unspecified: Secondary | ICD-10-CM | POA: Diagnosis not present

## 2019-08-09 DIAGNOSIS — Z0000 Encounter for general adult medical examination without abnormal findings: Secondary | ICD-10-CM | POA: Diagnosis not present

## 2019-08-09 DIAGNOSIS — Z125 Encounter for screening for malignant neoplasm of prostate: Secondary | ICD-10-CM

## 2019-08-09 DIAGNOSIS — E881 Lipodystrophy, not elsewhere classified: Secondary | ICD-10-CM

## 2019-08-09 DIAGNOSIS — Z1211 Encounter for screening for malignant neoplasm of colon: Secondary | ICD-10-CM

## 2019-08-09 MED ORDER — VARENICLINE TARTRATE 1 MG PO TABS: ORAL_TABLET | ORAL | 3 refills | Status: DC

## 2019-08-09 NOTE — Progress Notes (Signed)
Patient: Jeffrey Scott, Male    DOB: 22-May-1956, 63 y.o.   MRN: [id number] Visit Date: 08/09/2019  Today's Provider: Mila Merryonald Kerith Sherley, MD   Chief Complaint  Patient presents with  . Annual Exam  . Hyperlipidemia  . COPD   Subjective:     Annual physical exam Jeffrey Rumblehillip O Orser is a 63 y.o. male who presents today for health maintenance and complete physical. He feels fairly well. He reports no regular exercising. He reports he is sleeping fairly well.  -----------------------------------------------------------------   Lipid/Cholesterol, Follow-up:   Last seen for this1 years ago.  Management changes since that visit include none. . Last Lipid Panel:    Component Value Date/Time   CHOL 162 03/13/2018 0952   TRIG 207 (H) 03/13/2018 0952   HDL 32 (L) 03/13/2018 0952   CHOLHDL 5.1 (H) 03/13/2018 0952   LDLCALC 89 03/13/2018 0952    Risk factors for vascular disease include hypercholesterolemia  He reports good compliance with treatment. He is not having side effects.  Current symptoms include none and have been stable. Weight trend: fluctuating a bit Prior visit with dietician: no Current diet: well balanced Current exercise: none  Wt Readings from Last 3 Encounters:  08/09/19 235 lb (106.6 kg)  03/13/18 247 lb 6.4 oz (112.2 kg)  03/18/17 225 lb (102.1 kg)    -------------------------------------------------------------------  Follow up for COPD:  The patient was last seen for this 1 years ago. Changes made at last visit include changing Spiriva to Stiolto. Patient was also counseled on the importance of smoking cessation.   He reports good compliance with treatment. He feels that condition is Improved. He is not having side effects.   ------------------------------------------------------------------------------------  Review of Systems  Constitutional: Negative for appetite change, chills, fatigue and fever.  HENT: Negative for congestion,  ear pain, hearing loss, nosebleeds and trouble swallowing.   Eyes: Negative for pain and visual disturbance.  Respiratory: Negative for cough, chest tightness and shortness of breath.   Cardiovascular: Negative for chest pain, palpitations and leg swelling.  Gastrointestinal: Negative for abdominal pain, blood in stool, constipation, diarrhea, nausea and vomiting.  Endocrine: Negative for polydipsia, polyphagia and polyuria.  Genitourinary: Negative for dysuria and flank pain.  Musculoskeletal: Negative for arthralgias, back pain, joint swelling, myalgias and neck stiffness.  Skin: Negative for color change, rash and wound.  Neurological: Negative for dizziness, tremors, seizures, speech difficulty, weakness, light-headedness and headaches.  Hematological: Bruises/bleeds easily.  Psychiatric/Behavioral: Negative for behavioral problems, confusion, decreased concentration, dysphoric mood and sleep disturbance. The patient is not nervous/anxious.   All other systems reviewed and are negative.   Social History      He  reports that he has been smoking cigarettes. He started smoking about 52 years ago. He has a 25.00 pack-year smoking history. He has never used smokeless tobacco. He reports current alcohol use. He reports that he does not use drugs.       Social History   Socioeconomic History  . Marital status: Married    Spouse name: Not on file  . Number of children: 2  . Years of education: Not on file  . Highest education level: Not on file  Occupational History  . 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 Smoking status: Current Every Day Smoker    Packs/day: 0.50    Types: Cigarettes    Start date: 861969  . Smokeless tobacco: Never Used  .  Tobacco comment: smoking up to 1 ppd since age 86  Substance and Sexual Activity  . Alcohol use: Yes    Alcohol/week: 0.0 standard drinks    Comment: 1 beer each week  . Drug use: No  . Sexual  activity: Not on file  Other Topics Concern  . Not on file  Social History Narrative  . Not on file   Social Determinants of Health   Financial Resource Strain:   . Difficulty of Paying Living Expenses: Not on file  Food Insecurity:   . Worried About Programme researcher, broadcasting/film/video in the Last Year: Not on file  . Ran Out of Food in the Last Year: Not on file  Transportation Needs:   . Lack of Transportation (Medical): Not on file  . Lack of Transportation (Non-Medical): Not on file  Physical Activity:   . Days of Exercise per Week: Not on file  . Minutes of Exercise per Session: Not on file  Stress:   . Feeling of Stress : Not on file  Social Connections:   . Frequency of Communication with Friends and Family: Not on file  . Frequency of Social Gatherings with Friends and Family: Not on file  . Attends Religious Services: Not on file  . Active Member of Clubs or Organizations: Not on file  . Attends Banker Meetings: Not on file  . Marital Status: Not on file    Past Medical History:  Diagnosis Date  . History of chicken pox   . History of measles      Patient Active Problem List   Diagnosis Date Noted  . BMI 35.0-35.9,adult 03/13/2018  . Chronic obstructive pulmonary disease (HCC) 03/03/2017  . 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 Deviated nasal septum 06/14/2015  . Abnormal findings in stool 09/25/2009  . Fam hx-ischem heart disease 09/17/2008  . 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 Smoking greater than 30 pack years 10/20/2007    Past Surgical History:  Procedure Laterality Date  . EYE SURGERY Left 1982   for Diplopia  . NASAL SEPTUM SURGERY  2000   [facility]  . TONSILLECTOMY AND ADENOIDECTOMY      Family History        Family Status  Relation Name Status  . Mother  Alive  . Father  Deceased  . Brother  Alive        His family history includes CAD in his mother; Dementia in his father; Diabetes in his father and mother; Hypertension in his father and mother.       Allergies  Allergen Reactions  . Niaspan  [Niacin Er]   . Zyban  [Bupropion Hcl]   . Zyrtec  [Cetirizine]     malaise and fatigue     Current Outpatient Medications:  .  aspirin 81 MG tablet, Take 1 tablet by mouth daily., Disp: , Rfl:  .  lovastatin (MEVACOR) 40 MG tablet, TAKE 1 TABLET BY MOUTH EVERYDAY AT BEDTIME, Disp: 90 tablet, Rfl: 4 .  Oxymetazoline HCl-Menthol (AFRIN MENTHOL SPRAY) 0.05 % SOLN, as needed., Disp: , Rfl:  .  Tiotropium Bromide-Olodaterol (STIOLTO RESPIMAT) 2.5-2.5 MCG/ACT AERS, Inhale 2 puffs into the lungs daily., Disp: 4 g, Rfl: 12 .  varenicline (CHANTIX CONTINUING MONTH PAK) 1 MG tablet, TAKE 1 TABLET TWICE A DAY, Disp: 168 tablet, Rfl: 3   Patient Care Team: Malva Limes, MD as PCP - General (Family Medicine)    Objective:    Vitals: BP 112/62 (BP Location: Left Arm, Patient  Position: Sitting, Cuff Size: Large)   Pulse 85   Temp (!) 97.1 F (36.2 C) (Temporal)   Resp 18   Ht  (1.778 m)   Wt 235 lb (106.6 kg)   SpO2 95% Comment: room air  BMI 33.72 kg/m    Vitals:   08/09/19 1328  BP: 112/62  Pulse: 85  Resp: 18  Temp: (!) 97.1 F (36.2 C)  TempSrc: Temporal  SpO2: 95%  Weight: 235 lb (106.6 kg)  Height:  (1.778 m)     Physical Exam   General Appearance:    Obese male. Alert, cooperative, in no acute distress, appears stated age  Head:    Normocephalic, without obvious abnormality, atraumatic  Eyes:    PERRL, conjunctiva/corneas clear, EOM's intact, fundi    benign, both eyes       Ears:    Normal TM's and external ear canals, both ears  Nose:   Nares normal, septum midline, mucosa normal, no drainage   or sinus tenderness  Throat:   Lips, mucosa, and tongue normal; teeth and gums normal  Neck:   Supple, symmetrical, trachea midline, no adenopathy;       thyroid:  No enlargement/tenderness/nodules; no carotid   bruit or JVD  Back:     Symmetric, no curvature, ROM normal, no CVA tenderness  Lungs:     Clear  to auscultation bilaterally, respirations unlabored  Chest wall:    No tenderness or deformity  Heart:    Normal heart rate. Normal rhythm. No murmurs, rubs, or gallops.  S1 and S2 normal  Abdomen:     Soft, non-tender, bowel sounds active all four quadrants,    no masses, no organomegaly  Genitalia:    deferred  Rectal:    deferred  Extremities:   All extremities are intact. No cyanosis or edema  Pulses:   2+ and symmetric all extremities  Skin:   Skin color, texture, turgor normal, no rashes or lesions  Lymph nodes:   Cervical, supraclavicular, and axillary nodes normal  Neurologic:   CNII-XII intact. Normal strength, sensation and reflexes      throughout    Depression Screen PHQ 2/9 Scores 08/09/2019 03/13/2018 03/03/2017 06/14/2015  PHQ - 2 Score 0 0 0 0  PHQ- 9 Score 0 0 0 0       Assessment & Plan:     Routine Health Maintenance and Physical Exam  Exercise Activities and Dietary recommendations Goals   None     Immunization History  Administered Date(s) Administered  . Influenza-Unspecified 04/20/2015  . Tdap 11/14/2010    Health Maintenance  Topic Date Due  . COLONOSCOPY  06/29/2005  . TETANUS/TDAP  08/19/2024  . INFLUENZA VACCINE  Completed  . Hepatitis C Screening  Completed  . HIV Screening  Completed     Discussed health benefits of physical activity, and encouraged him to engage in regular exercise appropriate for his age and condition.    --------------------------------------------------------------------  1. Annual physical exam   2. Lipodystrophy He is tolerating lovastatin well with no adverse effects.   - Comprehensive metabolic panel - Lipid panel (fasting)  3. Chronic obstructive pulmonary disease, unspecified COPD type (HCC) Continue stiolto.   4. Smoking greater than 30 pack years Has cut down to about 1/2 ppd on Chantix.  - varenicline (CHANTIX CONTINUING MONTH PAK) 1 MG tablet; TAKE 1 TABLET TWICE A DAY  Dispense: 168 tablet;  Refill: 3  5. Colon cancer screening  - Cologuard  6.  Prostate cancer screening  - PSA    Lelon Huh, MD  [facility]

## 2019-08-09 NOTE — Patient Instructions (Addendum)
The CDC recommends two doses of Shingrix (the shingles vaccine) separated by 2 to 6 months for adults age 64 years and older. I recommend checking with your insurance plan regarding coverage for this vaccine.   . It is especially important to get the annual flu vaccine this year. If you haven't had it already, please go to your pharmacy or call the office as soon as possible to schedule you flu shot.

## 2019-08-10 LAB — COMPREHENSIVE METABOLIC PANEL
ALT: 18 IU/L (ref 0–44)
AST: 19 IU/L (ref 0–40)
Albumin/Globulin Ratio: 1.8 (ref 1.2–2.2)
Albumin: 4.2 g/dL (ref 3.8–4.8)
Alkaline Phosphatase: 62 IU/L (ref 39–117)
BUN/Creatinine Ratio: 10 (ref 10–24)
BUN: 14 mg/dL (ref 8–27)
Bilirubin Total: 0.3 mg/dL (ref 0.0–1.2)
CO2: 21 mmol/L (ref 20–29)
Calcium: 9.3 mg/dL (ref 8.6–10.2)
Chloride: 105 mmol/L (ref 96–106)
Creatinine, Ser: 1.37 mg/dL — ABNORMAL HIGH (ref 0.76–1.27)
GFR calc Af Amer: 63 mL/min/{1.73_m2} (ref >59)
GFR calc non Af Amer: 54 mL/min/{1.73_m2} — ABNORMAL LOW (ref >59)
Globulin, Total: 2.3 g/dL (ref 1.5–4.5)
Glucose: 90 mg/dL (ref 65–99)
Potassium: 4.6 mmol/L (ref 3.5–5.2)
Sodium: 142 mmol/L (ref 134–144)
Total Protein: 6.5 g/dL (ref 6.0–8.5)

## 2019-08-10 LAB — LIPID PANEL
Chol/HDL Ratio: 4.3 ratio (ref 0.0–5.0)
Cholesterol, Total: 128 mg/dL (ref 100–199)
HDL: 30 mg/dL — ABNORMAL LOW (ref >39)
LDL Chol Calc (NIH): 77 mg/dL (ref 0–99)
Triglycerides: 117 mg/dL (ref 0–149)
VLDL Cholesterol Cal: 21 mg/dL (ref 5–40)

## 2019-08-10 LAB — PSA: Prostate Specific Ag, Serum: 0.8 ng/mL (ref 0.0–4.0)

## 2019-09-10 ENCOUNTER — Other Ambulatory Visit: Payer: Self-pay | Admitting: Family Medicine

## 2019-09-10 LAB — COLOGUARD: Cologuard: NEGATIVE

## 2019-09-10 NOTE — Telephone Encounter (Signed)
Requested medication (s) are due for refill today: Yes  Requested medication (s) are on the active medication list: Yes  Last refill:  05/31/18  Future visit scheduled: No  Notes to clinic:  Prescription has expired.    Requested Prescriptions  Pending Prescriptions Disp Refills   lovastatin (MEVACOR) 40 MG tablet [Pharmacy Med Name: LOVASTATIN 40 MG TABLET] 90 tablet 0    Sig: TAKE 1 TABLET EVERY DAY AT BEDTIME      Cardiovascular:  Antilipid - Statins Failed - 09/10/2019  2:01 PM      Failed - LDL in normal range and within 360 days    LDL Chol Calc (NIH)  Date Value Ref Range Status  08/09/2019 77 0 - 99 mg/dL Final          Failed - HDL in normal range and within 360 days    HDL  Date Value Ref Range Status  08/09/2019 30 (L) >39 mg/dL Final          Failed - Valid encounter within last 12 months    Recent Outpatient Visits           1 month ago Annual physical exam   [facility] Malva Limes, MD   1 year ago Annual physical exam   [facility] Malva Limes, MD   2 years ago Annual physical exam   [facility] Malva Limes, MD   4 years ago Annual physical exam   [facility] Malva Limes, MD              Passed - Total Cholesterol in normal range and within 360 days    Cholesterol, Total  Date Value Ref Range Status  08/09/2019 128 100 - 199 mg/dL Final          Passed - Triglycerides in normal range and within 360 days    Triglycerides  Date Value Ref Range Status  08/09/2019 117 0 - 149 mg/dL Final          Passed - Patient is not pregnant

## 2019-09-13 NOTE — Telephone Encounter (Signed)
L.O.V. was on 08/09/2019 and medication was send into pharmacy.

## 2019-12-06 ENCOUNTER — Other Ambulatory Visit: Payer: Self-pay | Admitting: Family Medicine

## 2020-08-04 ENCOUNTER — Other Ambulatory Visit: Payer: Self-pay | Admitting: Family Medicine

## 2020-08-04 NOTE — Telephone Encounter (Signed)
Requested medication (s) are due for refill today- yes  Requested medication (s) are on the active medication list -yes  Future visit scheduled -yes  Last refill: 07/04/20  Notes to clinic: Request RF non delegated rx  Requested Prescriptions  Pending Prescriptions Disp Refills   STIOLTO RESPIMAT 2.5-2.5 MCG/ACT AERS [Pharmacy Med Name: STIOLTO RESPIMAT INHAL SPRAY] 4 g 0    Sig: Inhale 2 puffs into the lungs daily.      Off-Protocol Failed - 08/04/2020 10:22 AM      Failed - Medication not assigned to a protocol, review manually.      Failed - Valid encounter within last 12 months    Recent Outpatient Visits           12 months ago Annual physical exam   [facility] Malva Limes, MD   2 years ago Annual physical exam   [facility] Malva Limes, MD   3 years ago Annual physical exam   [facility] Malva Limes, MD   5 years ago Annual physical exam   [facility] Malva Limes, MD       Future Appointments             In 1 month Fisher, Demetrios Isaacs, MD [facility]                 Requested Prescriptions  Pending Prescriptions Disp Refills   STIOLTO RESPIMAT 2.5-2.5 MCG/ACT AERS [Pharmacy Med Name: STIOLTO RESPIMAT INHAL SPRAY] 4 g 0    Sig: Inhale 2 puffs into the lungs daily.      Off-Protocol Failed - 08/04/2020 10:22 AM      Failed - Medication not assigned to a protocol, review manually.      Failed - Valid encounter within last 12 months    Recent Outpatient Visits           12 months ago Annual physical exam   [facility] Malva Limes, MD   2 years ago Annual physical exam   [facility] Malva Limes, MD   3 years ago Annual physical exam   [facility] Malva Limes, MD   5 years ago Annual physical exam   [facility] Malva Limes, MD       Future Appointments             In  1 month Fisher, Demetrios Isaacs, MD [facility]

## 2020-08-28 NOTE — Patient Instructions (Addendum)
I recommend that you get the Pneumovax-23 vaccine to protect yourself from certain strains of pneumonia. Please call our office at [phone] at your earliest convenience to schedule this vaccine.    Two doses of Shingrix (the shingles vaccine) separated by 2 to 6 months are recommended for adults age 66 years and older. I recommend checking with your pharmacy plan regarding coverage for this vaccine.

## 2020-09-15 ENCOUNTER — Ambulatory Visit (INDEPENDENT_AMBULATORY_CARE_PROVIDER_SITE_OTHER): Payer: Medicare Other | Admitting: Family Medicine

## 2020-09-15 ENCOUNTER — Telehealth: Payer: Self-pay | Admitting: Family Medicine

## 2020-09-15 ENCOUNTER — Other Ambulatory Visit: Payer: Self-pay

## 2020-09-15 ENCOUNTER — Encounter: Payer: Self-pay | Admitting: Family Medicine

## 2020-09-15 VITALS — BP 132/78 | HR 87 | Temp 98.0°F | Resp 16 | Ht 70.0 in | Wt 243.0 lb

## 2020-09-15 DIAGNOSIS — Z125 Encounter for screening for malignant neoplasm of prostate: Secondary | ICD-10-CM | POA: Diagnosis not present

## 2020-09-15 DIAGNOSIS — F17210 Nicotine dependence, cigarettes, uncomplicated: Secondary | ICD-10-CM

## 2020-09-15 DIAGNOSIS — Z0000 Encounter for general adult medical examination without abnormal findings: Secondary | ICD-10-CM

## 2020-09-15 DIAGNOSIS — Z23 Encounter for immunization: Secondary | ICD-10-CM | POA: Diagnosis not present

## 2020-09-15 DIAGNOSIS — E881 Lipodystrophy, not elsewhere classified: Secondary | ICD-10-CM | POA: Diagnosis not present

## 2020-09-15 MED ORDER — NORTRIPTYLINE HCL 25 MG PO CAPS: ORAL_CAPSULE | ORAL | 1 refills | Status: DC

## 2020-09-15 MED ORDER — NORTRIPTYLINE HCL 50 MG PO CAPS: ORAL_CAPSULE | ORAL | 2 refills | Status: DC

## 2020-09-15 NOTE — Progress Notes (Signed)
Complete physical exam   Patient: Jeffrey Scott   DOB: Apr 06, 1956   65 y.o. Male  MRN: [id number] Visit Date: 09/15/2020  Today's healthcare provider: Mila Merry, MD   Chief Complaint  Patient presents with  . Welcome to [employer]  . Hyperlipidemia   Subjective    Jeffrey Scott is a 65 y.o. male who presents today for a Welcome to Medicare exam.  He reports consuming a general diet. The patient does not participate in regular exercise at present. He generally feels fairly well. He reports sleeping fairly well. He does not have additional problems to discuss today.   Lipid/Cholesterol, Follow-up  Last lipid panel Other pertinent labs  Lab Results  Component Value Date   CHOL 128 08/09/2019   HDL 30 (L) 08/09/2019   LDLCALC 77 08/09/2019   TRIG 117 08/09/2019   CHOLHDL 4.3 08/09/2019   Lab Results  Component Value Date   ALT 18 08/09/2019   AST 19 08/09/2019   PLT 210 03/13/2018   TSH 1.230 03/13/2018     He was last seen for this 1 years ago.  Management since that visit includes no medication changes.  He reports good compliance with treatment. He is not having side effects.   Symptoms: No chest pain No chest pressure/discomfort  No dyspnea No lower extremity edema  No numbness or tingling of extremity No orthopnea  No palpitations No paroxysmal nocturnal dyspnea  No speech difficulty No syncope   Current diet: well balanced Current exercise: no regular exercise  The ASCVD Risk score Denman George DC Jr., et al., 2013) failed to calculate for the following reasons:   The valid total cholesterol range is 130 to 320 mg/dL  COPD, Follow up  He was last seen for this 1 years ago. Changes made include no medication changes.   He reports good compliance with treatment. He is not having side effects.  he uses rescue inhaler 0 per days. He is NOT experiencing dyspnea or wheezing. he reports breathing is Unchanged.  Pulmonary Functions Testing  Results:  No results found for: FEV1, FVC, FEV1FVC, TLC    Past Medical History:  Diagnosis Date  . History of chicken pox   . History of measles    Past Surgical History:  Procedure Laterality Date  . EYE SURGERY Left 1982   for Diplopia  . NASAL SEPTUM SURGERY  2000   [facility]  . TONSILLECTOMY AND ADENOIDECTOMY     Social History   Socioeconomic History  . Marital status: Married    Spouse name: Not on file  . Number of children: 2  . Years of education: Not on file  . Highest education level: Not on file  Occupational History  . Occupation: Theatre stage manager    Comment: Retired  April 2020  Tobacco Use  . Smoking status: Current Every Day Smoker    Packs/day: 0.50    Types: Cigarettes    Start date: 40  . Smokeless tobacco: Never Used  . Tobacco comment: smoking up to 1 ppd since age 50  Substance and Sexual Activity  . Alcohol use: Yes    Alcohol/week: 0.0 standard drinks    Comment: 1 beer each week  . Drug use: No  . Sexual activity: Not on file  Other Topics Concern  . Not on file  Social History Narrative  . Not on file   Social Determinants of Health   Financial Resource Strain: Not on file  Food Insecurity:  Not on file  Transportation Needs: Not on file  Physical Activity: Not on file  Stress: Not on file  Social Connections: Not on file  Intimate Partner Violence: Not on file   Family Status  Relation Name Status  . Mother  Alive  . Father  Deceased  . Brother  Alive   Family History  Problem Relation Age of Onset  . Diabetes Mother   . CAD Mother        s/p PTCA and stents  . Hypertension Mother   . Dementia Father   . Diabetes Father   . Hypertension Father    Allergies  Allergen Reactions  . Niaspan  [Niacin Er]   . Zyban  [Bupropion Hcl]   . Zyrtec  [Cetirizine]     malaise and fatigue    Patient Care Team: Malva Limes, MD as PCP - General (Family Medicine)   Medications: Outpatient Medications Prior  to Visit  Medication Sig  . aspirin 81 MG tablet Take 1 tablet by mouth daily.  Marland Kitchen lovastatin (MEVACOR) 40 MG tablet TAKE 1 TABLET EVERY DAY AT BEDTIME  . Oxymetazoline HCl-Menthol 0.05 % SOLN as needed.  Marland Kitchen STIOLTO RESPIMAT 2.5-2.5 MCG/ACT AERS Inhale 2 puffs into the lungs daily.  . varenicline (CHANTIX CONTINUING MONTH PAK) 1 MG tablet TAKE 1 TABLET TWICE A DAY   No facility-administered medications prior to visit.    Review of Systems  Constitutional: Negative.   HENT: Negative.   Eyes: Negative.   Respiratory: Negative.   Cardiovascular: Negative.   Gastrointestinal: Negative.   Endocrine: Negative.   Genitourinary: Negative.   Musculoskeletal: Negative.   Skin: Negative.   Allergic/Immunologic: Negative.   Neurological: Negative.   Hematological: Negative.   Psychiatric/Behavioral: Negative.       Objective    BP 132/78   Pulse 87   Temp 98 F (36.7 C)   Resp 16   Ht 5\' 10"  (1.778 m)   Wt 243 lb (110.2 kg)   BMI 34.87 kg/m    Physical Exam   General Appearance:    Obese male. Alert, cooperative, in no acute distress, appears stated age  Head:    Normocephalic, without obvious abnormality, atraumatic  Eyes:    PERRL, conjunctiva/corneas clear, EOM's intact, fundi    benign, both eyes       Ears:    Normal TM's and external ear canals, both ears  Neck:   Supple, symmetrical, trachea midline, no adenopathy;       thyroid:  No enlargement/tenderness/nodules; no carotid   bruit or JVD  Back:     Symmetric, no curvature, ROM normal, no CVA tenderness  Lungs:     Clear to auscultation bilaterally, respirations unlabored  Chest wall:    No tenderness or deformity  Heart:    Normal heart rate. Normal rhythm. No murmurs, rubs, or gallops.  S1 and S2 normal  Abdomen:     Soft, non-tender, bowel sounds active all four quadrants,    no masses, no organomegaly  Genitalia:    deferred  Rectal:    deferred  Extremities:   All extremities are intact. No cyanosis or  edema  Pulses:   2+ and symmetric all extremities  Skin:   Skin color, texture, turgor normal, no rashes or lesions  Lymph nodes:   Cervical, supraclavicular, and axillary nodes normal  Neurologic:   CNII-XII intact. Normal strength, sensation and reflexes      throughout     Last depression screening  scores PHQ 2/9 Scores 09/15/2020 08/09/2019 03/13/2018  PHQ - 2 Score 0 0 0  PHQ- 9 Score 0 0 0   Last fall risk screening Fall Risk  09/15/2020  Falls in the past year? 0  Number falls in past yr: 0  Injury with Fall? 0  Risk for fall due to : No Fall Risks  Follow up Falls evaluation completed   Last Audit-C alcohol use screening Alcohol Use Disorder Test (AUDIT) 09/15/2020  1. How often do you have a drink containing alcohol? 1  2. How many drinks containing alcohol do you have on a typical day when you are drinking? 0  3. How often do you have six or more drinks on one occasion? 0  AUDIT-C Score 1  Alcohol Brief Interventions/Follow-up AUDIT Score <7 follow-up not indicated   A score of 3 or more in women, and 4 or more in men indicates increased risk for alcohol abuse, EXCEPT if all of the points are from question 1   No results found for any visits on 09/15/20.  Assessment & Plan    Routine Health Maintenance and Physical Exam  Exercise Activities and Dietary recommendations Goals   None     Immunization History  Administered Date(s) Administered  . Influenza-Unspecified 04/20/2015  . Tdap 11/14/2010    Health Maintenance  Topic Date Due  . PNA vac Low Risk Adult (1 of 2 - PCV13) Never done  . COVID-19 Vaccine (1) 10/01/2020 (Originally 06/29/1961)  . INFLUENZA VACCINE  11/16/2020 (Originally 03/19/2020)  . Fecal DNA (Cologuard)  09/01/2022  . TETANUS/TDAP  08/19/2024  . Hepatitis C Screening  Completed  . HIV Screening  Completed    Discussed health benefits of physical activity, and encouraged him to engage in regular exercise appropriate for his age and  condition.  1. 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   - EKG 12-Lead - CBC - Comprehensive metabolic panel - Lipid panel  3. Prostate cancer screening  - PSA Total (Reflex To Free) ([facility] only)  4. Need for influenza vaccination He refused fl vaccine today  5. Need for vaccination against Streptococcus pneumoniae He refused vaccine today. He refused Covid vaccine He refused shigles vaccine.   6. Smoking greater than 30 pack years  - EKG 12-Lead  Did not tolerate bupropion. Cut back when he was taking chantix, but stopped due to concerns about potential side effects. Will try- nortriptyline (PAMELOR) 50 MG capsule; Take 1/2 tablet at bedtime for 8 days, then increase to 1 tablet at bedtime for smoking cessation  Dispense: 30 capsule; Refill: 2 Consider increasing to 75 if 50 not effective.   - [place] AORT[place] MEDICARE SCREENING; Future - Ambulatory Referral for Lung Cancer Scre   No follow-ups on file.     The entirety of the information documented in the History of Present Illness, Review of Systems and Physical Exam were personally obtained by me. Portions of this information were initially documented by the CMA and reviewed by me for thoroughness and accuracy.      Jeffrey Merry, MD  [facility] [phone] (phone) [phone] (fax)  [facility]

## 2020-09-15 NOTE — Telephone Encounter (Signed)
Melanie calling from [facility] is calling for clarity for the nortriptyline (PAMELOR) 50 MG capsule [[id number]]  Sig states take 1.2 a tablet at beditime. Which will not work. Medication does not come in a tablet. Please advise Cb- [phone]

## 2020-09-15 NOTE — Telephone Encounter (Signed)
Have sent new prescription for 25mg  capsules with different directions instead of the 50mg  capsules.

## 2020-09-15 NOTE — Telephone Encounter (Signed)
Dr. Sherrie Mustache, please review. Medication does not come in a tablet.

## 2020-09-16 LAB — LIPID PANEL
Chol/HDL Ratio: 4.7 ratio (ref 0.0–5.0)
Cholesterol, Total: 164 mg/dL (ref 100–199)
HDL: 35 mg/dL — ABNORMAL LOW (ref >39)
LDL Chol Calc (NIH): 107 mg/dL — ABNORMAL HIGH (ref 0–99)
Triglycerides: 121 mg/dL (ref 0–149)
VLDL Cholesterol Cal: 22 mg/dL (ref 5–40)

## 2020-09-16 LAB — COMPREHENSIVE METABOLIC PANEL
ALT: 21 IU/L (ref 0–44)
AST: 15 IU/L (ref 0–40)
Albumin/Globulin Ratio: 1.9 (ref 1.2–2.2)
Albumin: 4.4 g/dL (ref 3.8–4.8)
Alkaline Phosphatase: 68 IU/L (ref 44–121)
BUN/Creatinine Ratio: 10 (ref 10–24)
BUN: 14 mg/dL (ref 8–27)
Bilirubin Total: 0.4 mg/dL (ref 0.0–1.2)
CO2: 21 mmol/L (ref 20–29)
Calcium: 9.6 mg/dL (ref 8.6–10.2)
Chloride: 103 mmol/L (ref 96–106)
Creatinine, Ser: 1.39 mg/dL — ABNORMAL HIGH (ref 0.76–1.27)
GFR calc Af Amer: 61 mL/min/{1.73_m2} (ref >59)
GFR calc non Af Amer: 53 mL/min/{1.73_m2} — ABNORMAL LOW (ref >59)
Globulin, Total: 2.3 g/dL (ref 1.5–4.5)
Glucose: 100 mg/dL — ABNORMAL HIGH (ref 65–99)
Potassium: 4.8 mmol/L (ref 3.5–5.2)
Sodium: 140 mmol/L (ref 134–144)
Total Protein: 6.7 g/dL (ref 6.0–8.5)

## 2020-09-16 LAB — CBC
Hematocrit: 48.4 % (ref 37.5–51.0)
Hemoglobin: 16.3 g/dL (ref 13.0–17.7)
MCH: 31.1 pg (ref 26.6–33.0)
MCHC: 33.7 g/dL (ref 31.5–35.7)
MCV: 92 fL (ref 79–97)
Platelets: 203 10*3/uL (ref 150–450)
RBC: 5.24 x10E6/uL (ref 4.14–5.80)
RDW: 13.3 % (ref 11.6–15.4)
WBC: 8 10*3/uL (ref 3.4–10.8)

## 2020-09-16 LAB — PSA TOTAL (REFLEX TO FREE): Prostate Specific Ag, Serum: 0.7 ng/mL (ref 0.0–4.0)

## 2020-09-22 ENCOUNTER — Ambulatory Visit
Admission: RE | Admit: 2020-09-22 | Discharge: 2020-09-22 | Disposition: A | Discharge status: Home / Self Care | Payer: Medicare Other | Source: Ambulatory Visit | Attending: Family Medicine | Admitting: Family Medicine

## 2020-09-22 ENCOUNTER — Other Ambulatory Visit: Payer: Self-pay

## 2020-09-22 DIAGNOSIS — F17210 Nicotine dependence, cigarettes, uncomplicated: Secondary | ICD-10-CM | POA: Diagnosis not present

## 2020-09-26 ENCOUNTER — Other Ambulatory Visit: Payer: Self-pay | Admitting: *Deleted

## 2020-09-26 DIAGNOSIS — Z122 Encounter for screening for malignant neoplasm of respiratory organs: Secondary | ICD-10-CM

## 2020-09-26 DIAGNOSIS — Z87891 Personal history of nicotine dependence: Secondary | ICD-10-CM

## 2020-09-26 DIAGNOSIS — F17200 Nicotine dependence, unspecified, uncomplicated: Secondary | ICD-10-CM

## 2020-09-26 NOTE — Progress Notes (Signed)
Contacted and scheduled for annual lung screening scan. Patient is a current smoker with a 40 pack year history.  

## 2020-10-04 ENCOUNTER — Ambulatory Visit
Admission: RE | Admit: 2020-10-04 | Discharge: 2020-10-04 | Disposition: A | Discharge status: Home / Self Care | Payer: Medicare Other | Source: Ambulatory Visit | Attending: Oncology | Admitting: Oncology

## 2020-10-04 ENCOUNTER — Other Ambulatory Visit: Payer: Self-pay

## 2020-10-04 DIAGNOSIS — Z122 Encounter for screening for malignant neoplasm of respiratory organs: Secondary | ICD-10-CM | POA: Diagnosis present

## 2020-10-04 DIAGNOSIS — Z87891 Personal history of nicotine dependence: Secondary | ICD-10-CM | POA: Insufficient documentation

## 2020-10-04 DIAGNOSIS — F17200 Nicotine dependence, unspecified, uncomplicated: Secondary | ICD-10-CM | POA: Insufficient documentation

## 2020-10-05 ENCOUNTER — Encounter: Payer: Self-pay | Admitting: Family Medicine

## 2020-10-06 ENCOUNTER — Encounter: Payer: Self-pay | Admitting: *Deleted

## 2020-10-09 ENCOUNTER — Encounter: Payer: Self-pay | Admitting: Family Medicine

## 2020-10-09 DIAGNOSIS — I2510 Atherosclerotic heart disease of native coronary artery without angina pectoris: Secondary | ICD-10-CM | POA: Insufficient documentation

## 2020-10-09 DIAGNOSIS — I700 Atherosclerosis of aorta: Secondary | ICD-10-CM | POA: Insufficient documentation

## 2020-11-17 ENCOUNTER — Other Ambulatory Visit: Payer: Self-pay | Admitting: Family Medicine

## 2020-11-17 DIAGNOSIS — Z0000 Encounter for general adult medical examination without abnormal findings: Secondary | ICD-10-CM

## 2021-02-26 ENCOUNTER — Other Ambulatory Visit: Payer: Self-pay | Admitting: Family Medicine

## 2021-02-26 NOTE — Telephone Encounter (Signed)
Requested medication (s) are due for refill today: expired medication  Requested medication (s) are on the active medication list: yes  Last refill:  12/06/19 #90 4 refills   Future visit scheduled: no  Notes to clinic:  expired medication. Do you want to renew Rx?     Requested Prescriptions  Pending Prescriptions Disp Refills   lovastatin (MEVACOR) 40 MG tablet [Pharmacy Med Name: LOVASTATIN 40 MG TABLET] 90 tablet 0    Sig: TAKE 1 TABLET EVERY DAY AT BEDTIME      Cardiovascular:  Antilipid - Statins Failed - 02/26/2021 12:47 PM      Failed - LDL in normal range and within 360 days    LDL Chol Calc (NIH)  Date Value Ref Range Status  09/15/2020 107 (H) 0 - 99 mg/dL Final          Failed - HDL in normal range and within 360 days    HDL  Date Value Ref Range Status  09/15/2020 35 (L) >39 mg/dL Final          Passed - Total Cholesterol in normal range and within 360 days    Cholesterol, Total  Date Value Ref Range Status  09/15/2020 164 100 - 199 mg/dL Final          Passed - Triglycerides in normal range and within 360 days    Triglycerides  Date Value Ref Range Status  09/15/2020 121 0 - 149 mg/dL Final          Passed - Patient is not pregnant      Passed - Valid encounter within last 12 months    Recent Outpatient Visits           5 months ago Welcome to [employer] preventive visit   [facility] Fisher, Demetrios Isaacs, MD   1 year ago Annual physical exam   [facility] Malva Limes, MD   2 years ago Annual physical exam   [facility] Malva Limes, MD   3 years ago Annual physical exam   [facility] Malva Limes, MD   5 years ago Annual physical exam   [facility] Malva Limes, MD

## 2021-06-01 ENCOUNTER — Other Ambulatory Visit: Payer: Self-pay | Admitting: Family Medicine

## 2021-08-06 ENCOUNTER — Other Ambulatory Visit: Payer: Self-pay | Admitting: Family Medicine

## 2021-11-01 ENCOUNTER — Telehealth: Payer: Self-pay | Admitting: *Deleted

## 2021-11-01 NOTE — Telephone Encounter (Signed)
LMTC to schedule Yearly Lung CA CT Scan. 

## 2021-12-13 ENCOUNTER — Telehealth: Payer: Self-pay | Admitting: Family Medicine

## 2021-12-13 NOTE — Telephone Encounter (Signed)
Patient established care with [facility] on 10/31/2021.  ?

## 2021-12-13 NOTE — Telephone Encounter (Signed)
[employer] faxed refill request for the following medications: ? ?STIOLTO RESPIMAT 2.5-2.5 MCG/ACT AERS  ? ?Please advise. ? ?

## 2022-11-26 ENCOUNTER — Other Ambulatory Visit: Payer: Self-pay

## 2022-11-26 ENCOUNTER — Other Ambulatory Visit (INDEPENDENT_AMBULATORY_CARE_PROVIDER_SITE_OTHER): Payer: Self-pay | Admitting: *Deleted

## 2022-11-26 DIAGNOSIS — I739 Peripheral vascular disease, unspecified: Secondary | ICD-10-CM

## 2022-11-29 ENCOUNTER — Ambulatory Visit (INDEPENDENT_AMBULATORY_CARE_PROVIDER_SITE_OTHER): Payer: Medicare Other

## 2022-11-29 DIAGNOSIS — I739 Peripheral vascular disease, unspecified: Secondary | ICD-10-CM | POA: Diagnosis not present

## 2022-12-02 ENCOUNTER — Encounter (INDEPENDENT_AMBULATORY_CARE_PROVIDER_SITE_OTHER): Payer: Self-pay | Admitting: Vascular Surgery

## 2022-12-02 ENCOUNTER — Ambulatory Visit (INDEPENDENT_AMBULATORY_CARE_PROVIDER_SITE_OTHER): Payer: Medicare Other | Admitting: Vascular Surgery

## 2022-12-02 VITALS — BP 116/66 | HR 116 | Resp 18 | Ht 68.0 in | Wt 237.0 lb

## 2022-12-02 DIAGNOSIS — I2510 Atherosclerotic heart disease of native coronary artery without angina pectoris: Secondary | ICD-10-CM

## 2022-12-02 DIAGNOSIS — E782 Mixed hyperlipidemia: Secondary | ICD-10-CM

## 2022-12-02 DIAGNOSIS — I70213 Atherosclerosis of native arteries of extremities with intermittent claudication, bilateral legs: Secondary | ICD-10-CM

## 2022-12-02 DIAGNOSIS — J449 Chronic obstructive pulmonary disease, unspecified: Secondary | ICD-10-CM | POA: Diagnosis not present

## 2022-12-02 NOTE — Progress Notes (Unsigned)
MRN : [id number]  Jeffrey Scott is a 67 y.o. (08/07/1956) male who presents with chief complaint of check circulation.  History of Present Illness: ***  No outpatient medications have been marked as taking for the 12/02/22 encounter (Appointment) with Gilda Crease, Latina Craver, MD.    Past Medical History:  Diagnosis Date   History of chicken pox    History of measles     Past Surgical History:  Procedure Laterality Date   EYE SURGERY Left 1982   for Diplopia   NASAL SEPTUM SURGERY  2000   [facility]   TONSILLECTOMY AND ADENOIDECTOMY      Social History Social History   Tobacco Use   Smoking status: Every Day    Packs/day: .5    Types: Cigarettes    Start date: 1969   Smokeless tobacco: Never   Tobacco comments:    smoking up to 1 ppd since age 25  Substance Use Topics   Alcohol use: Yes    Alcohol/week: 0.0 standard drinks of alcohol    Comment: 1 beer each week   Drug use: No    Family History Family History  Problem Relation Age of Onset   Diabetes Mother    CAD Mother        s/p PTCA and stents   Hypertension Mother    Dementia Father    Diabetes Father    Hypertension Father     Allergies  Allergen Reactions   Niaspan  [Niacin Er]    Zyban  [Bupropion Hcl]    Zyrtec  [Cetirizine]     malaise and fatigue     REVIEW OF SYSTEMS (Negative unless checked)  Constitutional: [] Weight loss  [] Fever  [] Chills Cardiac: [] Chest pain   [] Chest pressure   [] Palpitations   [] Shortness of breath when laying flat   [] Shortness of breath with exertion. Vascular:  [x] Pain in legs with walking   [] Pain in legs at rest  [] History of DVT   [] Phlebitis   [] Swelling in legs   [] Varicose veins   [] Non-healing ulcers Pulmonary:   [] Uses home oxygen   [] Productive cough   [] Hemoptysis   [] Wheeze  [] COPD   [] Asthma Neurologic:  [] Dizziness   [] Seizures   [] History of stroke   [] History of TIA  [] Aphasia   [] Vissual changes   [] Weakness or numbness  in arm   [] Weakness or numbness in leg Musculoskeletal:   [] Joint swelling   [] Joint pain   [] Low back pain Hematologic:  [] Easy bruising  [] Easy bleeding   [] Hypercoagulable state   [] Anemic Gastrointestinal:  [] Diarrhea   [] Vomiting  [] Gastroesophageal reflux/heartburn   [] Difficulty swallowing. Genitourinary:  [] Chronic kidney disease   [] Difficult urination  [] Frequent urination   [] Blood in urine Skin:  [] Rashes   [] Ulcers  Psychological:  [] History of anxiety   []  History of major depression.  Physical Examination  There were no vitals filed for this visit. There is no height or weight on file to calculate BMI. Gen: WD/WN, NAD Head: Ruth/AT, No temporalis wasting.  Ear/Nose/Throat: Hearing grossly intact, nares w/o erythema or drainage Eyes: PER, EOMI, sclera nonicteric.  Neck: Supple, no masses.  No bruit or JVD.  Pulmonary:  Good air movement, no audible wheezing, no use of accessory muscles.  Cardiac: RRR, normal S1, S2, no Murmurs. Vascular:  mild trophic changes, no open wounds Vessel Right Left  Radial Palpable Palpable  PT Not Palpable  Not Palpable  DP Not Palpable Not Palpable  Gastrointestinal: soft, non-distended. No guarding/no peritoneal signs.  Musculoskeletal: M/S 5/5 throughout.  No visible deformity.  Neurologic: CN 2-12 intact. Pain and light touch intact in extremities.  Symmetrical.  Speech is fluent. Motor exam as listed above. Psychiatric: Judgment intact, Mood & affect appropriate for pt's clinical situation. Dermatologic: No rashes or ulcers noted.  No changes consistent with cellulitis.   CBC Lab Results  Component Value Date   WBC 8.0 09/15/2020   HGB 16.3 09/15/2020   HCT 48.4 09/15/2020   MCV 92 09/15/2020   PLT 203 09/15/2020    BMET    Component Value Date/Time   NA 140 09/15/2020 1101   K 4.8 09/15/2020 1101   CL 103 09/15/2020 1101   CO2 21 09/15/2020 1101   GLUCOSE 100 (H) 09/15/2020 1101   BUN 14 09/15/2020 1101   CREATININE 1.39  (H) 09/15/2020 1101   CALCIUM 9.6 09/15/2020 1101   GFRNONAA 53 (L) 09/15/2020 1101   GFRAA 61 09/15/2020 1101   CrCl cannot be calculated (Patient's most recent lab result is older than the maximum 21 days allowed.).  COAG No results found for: "INR", "PROTIME"  Radiology No results found.   Assessment/Plan There are no diagnoses linked to this encounter.   Levora Dredge, MD  12/02/2022 1:09 PM

## 2022-12-02 NOTE — H&P (View-Only) (Signed)
               MRN : [id number]  Jeffrey Scott is a 66 y.o. (04/12/1956) male who presents with chief complaint of check circulation.  History of Present Illness: ***  No outpatient medications have been marked as taking for the 12/02/22 encounter (Appointment) with Merline Perkin G, MD.    Past Medical History:  Diagnosis Date   History of chicken pox    History of measles     Past Surgical History:  Procedure Laterality Date   EYE SURGERY Left 1982   for Diplopia   NASAL SEPTUM SURGERY  2000   [facility]   TONSILLECTOMY AND ADENOIDECTOMY      Social History Social History   Tobacco Use   Smoking status: Every Day    Packs/day: .5    Types: Cigarettes    Start date: 1969   Smokeless tobacco: Never   Tobacco comments:    smoking up to 1 ppd since age 11  Substance Use Topics   Alcohol use: Yes    Alcohol/week: 0.0 standard drinks of alcohol    Comment: 1 beer each week   Drug use: No    Family History Family History  Problem Relation Age of Onset   Diabetes Mother    CAD Mother        s/p PTCA and stents   Hypertension Mother    Dementia Father    Diabetes Father    Hypertension Father     Allergies  Allergen Reactions   Niaspan  [Niacin Er]    Zyban  [Bupropion Hcl]    Zyrtec  [Cetirizine]     malaise and fatigue     REVIEW OF SYSTEMS (Negative unless checked)  Constitutional: []Weight loss  []Fever  []Chills Cardiac: []Chest pain   []Chest pressure   []Palpitations   []Shortness of breath when laying flat   []Shortness of breath with exertion. Vascular:  [x]Pain in legs with walking   []Pain in legs at rest  []History of DVT   []Phlebitis   []Swelling in legs   []Varicose veins   []Non-healing ulcers Pulmonary:   []Uses home oxygen   []Productive cough   []Hemoptysis   []Wheeze  []COPD   []Asthma Neurologic:  []Dizziness   []Seizures   []History of stroke   []History of TIA  []Aphasia   []Vissual changes   []Weakness or numbness  in arm   []Weakness or numbness in leg Musculoskeletal:   []Joint swelling   []Joint pain   []Low back pain Hematologic:  []Easy bruising  []Easy bleeding   []Hypercoagulable state   []Anemic Gastrointestinal:  []Diarrhea   []Vomiting  []Gastroesophageal reflux/heartburn   []Difficulty swallowing. Genitourinary:  []Chronic kidney disease   []Difficult urination  []Frequent urination   []Blood in urine Skin:  []Rashes   []Ulcers  Psychological:  []History of anxiety   [] History of major depression.  Physical Examination  There were no vitals filed for this visit. There is no height or weight on file to calculate BMI. Gen: WD/WN, NAD Head: [place]/AT, No temporalis wasting.  Ear/Nose/Throat: Hearing grossly intact, nares w/o erythema or drainage Eyes: PER, EOMI, sclera nonicteric.  Neck: Supple, no masses.  No bruit or JVD.  Pulmonary:  Good air movement, no audible wheezing, no use of accessory muscles.  Cardiac: RRR, normal S1, S2, no Murmurs. Vascular:  mild trophic changes, no open wounds Vessel Right Left  Radial Palpable Palpable  PT Not Palpable   Not Palpable  DP Not Palpable Not Palpable  Gastrointestinal: soft, non-distended. No guarding/no peritoneal signs.  Musculoskeletal: M/S 5/5 throughout.  No visible deformity.  Neurologic: CN 2-12 intact. Pain and light touch intact in extremities.  Symmetrical.  Speech is fluent. Motor exam as listed above. Psychiatric: Judgment intact, Mood & affect appropriate for pt's clinical situation. Dermatologic: No rashes or ulcers noted.  No changes consistent with cellulitis.   CBC Lab Results  Component Value Date   WBC 8.0 09/15/2020   HGB 16.3 09/15/2020   HCT 48.4 09/15/2020   MCV 92 09/15/2020   PLT 203 09/15/2020    BMET    Component Value Date/Time   NA 140 09/15/2020 1101   K 4.8 09/15/2020 1101   CL 103 09/15/2020 1101   CO2 21 09/15/2020 1101   GLUCOSE 100 (H) 09/15/2020 1101   BUN 14 09/15/2020 1101   CREATININE 1.39  (H) 09/15/2020 1101   CALCIUM 9.6 09/15/2020 1101   GFRNONAA 53 (L) 09/15/2020 1101   GFRAA 61 09/15/2020 1101   CrCl cannot be calculated (Patient's most recent lab result is older than the maximum 21 days allowed.).  COAG No results found for: "INR", "PROTIME"  Radiology No results found.   Assessment/Plan There are no diagnoses linked to this encounter.   Nickalas Mccarrick, MD  12/02/2022 1:09 PM   

## 2022-12-04 ENCOUNTER — Encounter (INDEPENDENT_AMBULATORY_CARE_PROVIDER_SITE_OTHER): Payer: Self-pay | Admitting: Vascular Surgery

## 2022-12-04 DIAGNOSIS — J449 Chronic obstructive pulmonary disease, unspecified: Secondary | ICD-10-CM | POA: Insufficient documentation

## 2022-12-04 DIAGNOSIS — E785 Hyperlipidemia, unspecified: Secondary | ICD-10-CM | POA: Insufficient documentation

## 2022-12-04 DIAGNOSIS — I2510 Atherosclerotic heart disease of native coronary artery without angina pectoris: Secondary | ICD-10-CM | POA: Insufficient documentation

## 2022-12-04 DIAGNOSIS — I70219 Atherosclerosis of native arteries of extremities with intermittent claudication, unspecified extremity: Secondary | ICD-10-CM | POA: Insufficient documentation

## 2022-12-04 LAB — VAS US ABI WITH/WO TBI
Left ABI: 0.56
Right ABI: 1.25

## 2022-12-05 ENCOUNTER — Telehealth (INDEPENDENT_AMBULATORY_CARE_PROVIDER_SITE_OTHER): Payer: Self-pay

## 2022-12-05 NOTE — Telephone Encounter (Signed)
Spoke with the patient and he is scheduled with Dr. Gilda Crease  for a left leg angio with a 6:45 am arrival time to the [facility]. Pre-procedure instructions were discussed and will be sent to Mychart and mailed.

## 2022-12-17 ENCOUNTER — Encounter
Admission: RE | Disposition: A | Discharge status: Home / Self Care | Payer: Self-pay | Source: Home / Self Care | Attending: Vascular Surgery

## 2022-12-17 ENCOUNTER — Ambulatory Visit
Admission: RE | Admit: 2022-12-17 | Discharge: 2022-12-17 | Disposition: A | Discharge status: Home / Self Care | Payer: Medicare Other | Attending: Vascular Surgery | Admitting: Vascular Surgery

## 2022-12-17 ENCOUNTER — Other Ambulatory Visit: Payer: Self-pay

## 2022-12-17 ENCOUNTER — Encounter: Payer: Self-pay | Admitting: Vascular Surgery

## 2022-12-17 DIAGNOSIS — F17210 Nicotine dependence, cigarettes, uncomplicated: Secondary | ICD-10-CM | POA: Diagnosis not present

## 2022-12-17 DIAGNOSIS — E782 Mixed hyperlipidemia: Secondary | ICD-10-CM | POA: Insufficient documentation

## 2022-12-17 DIAGNOSIS — I2510 Atherosclerotic heart disease of native coronary artery without angina pectoris: Secondary | ICD-10-CM | POA: Diagnosis not present

## 2022-12-17 DIAGNOSIS — I70222 Atherosclerosis of native arteries of extremities with rest pain, left leg: Secondary | ICD-10-CM | POA: Insufficient documentation

## 2022-12-17 DIAGNOSIS — I70229 Atherosclerosis of native arteries of extremities with rest pain, unspecified extremity: Secondary | ICD-10-CM

## 2022-12-17 DIAGNOSIS — J449 Chronic obstructive pulmonary disease, unspecified: Secondary | ICD-10-CM | POA: Diagnosis not present

## 2022-12-17 DIAGNOSIS — I700 Atherosclerosis of aorta: Secondary | ICD-10-CM

## 2022-12-17 DIAGNOSIS — I70212 Atherosclerosis of native arteries of extremities with intermittent claudication, left leg: Secondary | ICD-10-CM

## 2022-12-17 HISTORY — PX: LOWER EXTREMITY ANGIOGRAPHY: CATH118251

## 2022-12-17 LAB — CREATININE, SERUM
Creatinine, Ser: 1.35 mg/dL — ABNORMAL HIGH (ref 0.61–1.24)
GFR, Estimated: 58 mL/min — ABNORMAL LOW (ref >60)

## 2022-12-17 LAB — BUN: BUN: 15 mg/dL (ref 8–23)

## 2022-12-17 SURGERY — LOWER EXTREMITY ANGIOGRAPHY
Anesthesia: Moderate Sedation | Site: Leg Lower | Laterality: Left

## 2022-12-17 MED ORDER — FENTANYL CITRATE (PF) 100 MCG/2ML IJ SOLN
INTRAMUSCULAR | Status: DC | PRN
  Administered 2022-12-17 (×2): 25 ug via INTRAVENOUS
  Administered 2022-12-17 (×2): 50 ug via INTRAVENOUS

## 2022-12-17 MED ORDER — IODIXANOL 320 MG/ML IV SOLN
INTRAVENOUS | Status: DC | PRN
  Administered 2022-12-17: 110 mL

## 2022-12-17 MED ORDER — HEPARIN SODIUM (PORCINE) 1000 UNIT/ML IJ SOLN
INTRAMUSCULAR | Status: AC
  Filled 2022-12-17: qty 10

## 2022-12-17 MED ORDER — SODIUM CHLORIDE 0.9 % IV SOLN: INTRAVENOUS | Status: DC

## 2022-12-17 MED ORDER — SODIUM CHLORIDE 0.9 % IV SOLN
INTRAVENOUS | Status: DC | PRN
  Administered 2022-12-17: 125 mL/h via INTRAVENOUS

## 2022-12-17 MED ORDER — HEPARIN SODIUM (PORCINE) 1000 UNIT/ML IJ SOLN
INTRAMUSCULAR | Status: DC | PRN
  Administered 2022-12-17: 6000 [IU] via INTRAVENOUS

## 2022-12-17 MED ORDER — HYDROMORPHONE HCL 1 MG/ML IJ SOLN: 1.0000 mg | Freq: Once | INTRAMUSCULAR | Status: DC | PRN

## 2022-12-17 MED ORDER — MIDAZOLAM HCL 5 MG/5ML IJ SOLN
INTRAMUSCULAR | Status: AC
  Filled 2022-12-17: qty 5

## 2022-12-17 MED ORDER — ACETAMINOPHEN 325 MG PO TABS: 650.0000 mg | ORAL_TABLET | ORAL | Status: DC | PRN

## 2022-12-17 MED ORDER — METHYLPREDNISOLONE SODIUM SUCC 125 MG IJ SOLR: 125.0000 mg | Freq: Once | INTRAMUSCULAR | Status: DC | PRN

## 2022-12-17 MED ORDER — SODIUM CHLORIDE 0.9 % IV SOLN: 250.0000 mL | INTRAVENOUS | Status: DC | PRN

## 2022-12-17 MED ORDER — ONDANSETRON HCL 4 MG/2ML IJ SOLN: 4.0000 mg | Freq: Four times a day (QID) | INTRAMUSCULAR | Status: DC | PRN

## 2022-12-17 MED ORDER — NITROGLYCERIN 1 MG/10 ML FOR IR/CATH LAB
INTRA_ARTERIAL | Status: DC | PRN
  Administered 2022-12-17: 300 ug via INTRA_ARTERIAL
  Administered 2022-12-17: 200 ug via INTRA_ARTERIAL
  Administered 2022-12-17: 300 ug via INTRA_ARTERIAL

## 2022-12-17 MED ORDER — SODIUM CHLORIDE 0.9% FLUSH: 3.0000 mL | Freq: Two times a day (BID) | INTRAVENOUS | Status: DC

## 2022-12-17 MED ORDER — LABETALOL HCL 5 MG/ML IV SOLN: 10.0000 mg | INTRAVENOUS | Status: DC | PRN

## 2022-12-17 MED ORDER — MIDAZOLAM HCL 2 MG/2ML IJ SOLN
INTRAMUSCULAR | Status: DC | PRN
  Administered 2022-12-17: 2 mg via INTRAVENOUS
  Administered 2022-12-17 (×3): 1 mg via INTRAVENOUS

## 2022-12-17 MED ORDER — NITROGLYCERIN 1 MG/10 ML FOR IR/CATH LAB
INTRA_ARTERIAL | Status: AC
  Filled 2022-12-17: qty 10

## 2022-12-17 MED ORDER — FENTANYL CITRATE PF 50 MCG/ML IJ SOSY
PREFILLED_SYRINGE | INTRAMUSCULAR | Status: AC
  Filled 2022-12-17: qty 1

## 2022-12-17 MED ORDER — DIPHENHYDRAMINE HCL 50 MG/ML IJ SOLN: 50.0000 mg | Freq: Once | INTRAMUSCULAR | Status: DC | PRN

## 2022-12-17 MED ORDER — SODIUM CHLORIDE 0.9% FLUSH: 3.0000 mL | INTRAVENOUS | Status: DC | PRN

## 2022-12-17 MED ORDER — MORPHINE SULFATE (PF) 4 MG/ML IV SOLN: 2.0000 mg | INTRAVENOUS | Status: DC | PRN

## 2022-12-17 MED ORDER — CLOPIDOGREL BISULFATE 300 MG PO TABS: 300.0000 mg | ORAL_TABLET | ORAL | Status: DC

## 2022-12-17 MED ORDER — CLOPIDOGREL BISULFATE 75 MG PO TABS: 75.0000 mg | ORAL_TABLET | Freq: Every day | ORAL | 11 refills | Status: DC

## 2022-12-17 MED ORDER — FAMOTIDINE 20 MG PO TABS: 40.0000 mg | ORAL_TABLET | Freq: Once | ORAL | Status: DC | PRN

## 2022-12-17 MED ORDER — FENTANYL CITRATE (PF) 100 MCG/2ML IJ SOLN
INTRAMUSCULAR | Status: AC
  Filled 2022-12-17: qty 2

## 2022-12-17 MED ORDER — CEFAZOLIN SODIUM-DEXTROSE 2-4 GM/100ML-% IV SOLN
INTRAVENOUS | Status: AC
  Filled 2022-12-17: qty 100

## 2022-12-17 MED ORDER — HYDRALAZINE HCL 20 MG/ML IJ SOLN: 5.0000 mg | INTRAMUSCULAR | Status: DC | PRN

## 2022-12-17 MED ORDER — DIPHENHYDRAMINE HCL 50 MG/ML IJ SOLN
INTRAMUSCULAR | Status: DC | PRN
  Administered 2022-12-17 (×2): 25 mg via INTRAVENOUS

## 2022-12-17 MED ORDER — DIPHENHYDRAMINE HCL 50 MG/ML IJ SOLN
INTRAMUSCULAR | Status: AC
  Filled 2022-12-17: qty 1

## 2022-12-17 MED ORDER — CEFAZOLIN SODIUM-DEXTROSE 2-4 GM/100ML-% IV SOLN
2.0000 g | INTRAVENOUS | Status: AC
  Administered 2022-12-17: 2 g via INTRAVENOUS

## 2022-12-17 MED ORDER — MIDAZOLAM HCL 2 MG/ML PO SYRP: 8.0000 mg | ORAL_SOLUTION | Freq: Once | ORAL | Status: DC | PRN

## 2022-12-17 MED ORDER — OXYCODONE HCL 5 MG PO TABS: 5.0000 mg | ORAL_TABLET | ORAL | Status: DC | PRN

## 2022-12-17 SURGICAL SUPPLY — 29 items
BALLN LUTONIX 5X220X130 (BALLOONS) ×2
BALLN LUTONIX 6X220X130 (BALLOONS) ×1
BALLN LUTONIX 7X80X130 (BALLOONS) ×1
BALLN LUTONIX DCB 6X100X130 (BALLOONS) ×1
BALLOON LUTONIX 5X220X130 (BALLOONS) IMPLANT
BALLOON LUTONIX 6X220X130 (BALLOONS) IMPLANT
BALLOON LUTONIX 7X80X130 (BALLOONS) IMPLANT
BALLOON LUTONIX DCB 6X100X130 (BALLOONS) IMPLANT
CATH ANGIO 5F PIGTAIL 65CM (CATHETERS) IMPLANT
CATH BEACON 5 .035 65 KMP TIP (CATHETERS) IMPLANT
CATH BEACON 5 .038 100 VERT TP (CATHETERS) IMPLANT
COVER PROBE ULTRASOUND 5X96 (MISCELLANEOUS) IMPLANT
DEVICE STARCLOSE SE CLOSURE (Vascular Products) IMPLANT
GLIDEWIRE ADV .035X260CM (WIRE) IMPLANT
GOWN STRL REUS W/ TWL LRG LVL3 (GOWN DISPOSABLE) ×1 IMPLANT
GOWN STRL REUS W/TWL LRG LVL3 (GOWN DISPOSABLE) ×1
KIT ENCORE 26 ADVANTAGE (KITS) IMPLANT
LIFESTENT SOLO 7X200X135 (Permanent Stent) IMPLANT
NDL ENTRY 21GA 7CM ECHOTIP (NEEDLE) IMPLANT
NEEDLE ENTRY 21GA 7CM ECHOTIP (NEEDLE) ×1 IMPLANT
PACK ANGIOGRAPHY (CUSTOM PROCEDURE TRAY) ×1 IMPLANT
SET INTRO CAPELLA COAXIAL (SET/KITS/TRAYS/PACK) IMPLANT
SHEATH ANL2 6FRX45 HC (SHEATH) IMPLANT
SHEATH BRITE TIP 5FRX11 (SHEATH) IMPLANT
STENT LIFESTENT 5F 7X100X135 (Permanent Stent) IMPLANT
STENT LIFESTENT 5F 7X40X135 (Permanent Stent) IMPLANT
SYR MEDRAD MARK 7 150ML (SYRINGE) IMPLANT
TUBING CONTRAST HIGH PRESS 72 (TUBING) IMPLANT
WIRE GUIDERIGHT .035X150 (WIRE) IMPLANT

## 2022-12-17 NOTE — Interval H&P Note (Signed)
History and Physical Interval Note:  12/17/2022 8:03 AM  Jeffrey Scott  has presented today for surgery, with the diagnosis of LLE Angio   BARD   ASO w rest pain.  The various methods of treatment have been discussed with the patient and family. After consideration of risks, benefits and other options for treatment, the patient has consented to  Procedure(s): Lower Extremity Angiography (Left) as a surgical intervention.  The patient's history has been reviewed, patient examined, no change in status, stable for surgery.  I have reviewed the patient's chart and labs.  Questions were answered to the patient's satisfaction.     Levora Dredge

## 2022-12-17 NOTE — Op Note (Signed)
[facility]  Percutaneous Study/Intervention Procedural Note   Date of Surgery: 12/17/2022  Surgeon:  Renford Dills, MD.  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  Introduction catheter into left lower extremity 3rd order catheter placement              2.    Contrast injection left lower extremity for distal runoff             3.  Percutaneous transluminal angioplasty and stent placement left superficial femoral artery              4.  Star close closure right common femoral arteriotomy  Anesthesia: Conscious sedation was administered under my direct supervision by the interventional radiology RN. IV Versed plus fentanyl were utilized. Continuous ECG, pulse oximetry and blood pressure was monitored throughout the entire procedure.  Conscious sedation was for a total of 58 minutes and 11 seconds.  Sheath: 6 [place] Ansell right common femoral retrograde  Contrast: 110 cc  Fluoroscopy Time: 12.6 minutes  Indications:  Jeffrey Scott presents with extremely short distance claudication of the left lower extremity.  He presented to the office for assessment secondary to his inability to carry out his daily activities.  Examination as well as noninvasive studies demonstrated severe atherosclerotic occlusive disease of the left lower extremity.  The risks and benefits for angiography with intervention are reviewed all questions answered patient agrees to proceed.  Procedure:  Jeffrey Scott is a 67 y.o. y.o. male who was identified and appropriate procedural time out was performed.  The patient was then placed supine on the table and prepped and draped in the usual sterile fashion.    Ultrasound was placed in the sterile sleeve and the right groin was evaluated the right  common femoral artery was echolucent and pulsatile indicating patency.  Image was recorded for the permanent record and under real-time visualization a microneedle was inserted into the common femoral artery microwire followed by a micro-sheath.  A J-wire was then advanced through the micro-sheath and a  5 [place] sheath was then inserted over a J-wire. J-wire was then advanced and a 5 French pigtail catheter was positioned at the level of T12. AP projection of the aorta was then obtained. Pigtail catheter was repositioned to above the bifurcation and a RAO view of the pelvis was obtained.  Subsequently a pigtail catheter with the advantage Glidewire was used to cross the aortic bifurcation the catheter wire were advanced down into the left distal external iliac artery. Oblique view of the femoral bifurcation was then obtained and subsequently the wire was reintroduced and the pigtail catheter negotiated into the SFA representing third order catheter placement. Distal runoff was then performed.  6000 units of heparin was then given and allowed to circulate and a 6 [place] Ansell sheath was advanced up and over the bifurcation and positioned in the femoral artery  KMP  catheter and advantage Glidewire were then negotiated through the occlusion and into the above-knee popliteal.  Hand-injection through the [place] within the popliteal demonstrates true lumen positioning with patency of the trifurcation. The advantage wire was then reintroduced and a 5 mm x 220 mm Lutonix balloon was used to angioplasty the superficial femoral artery and additional 5 mm x 100 mm Lutonix drug-eluting balloon was used.  Inflations were to 8 atmospheres for 1 minute.  Greater than 60% residual stenosis was noted throughout the distal occlusion.  The occluded SFA measured approximately 300 mm in length.  Therefore I elected to place stents.  Initially a 7 mm x 200 mm life stent was deployed with its distal end in the popliteal I then  extended proximally with a 7 mm x 100 mm life stent.  These were then postdilated with 6 mm Lutonix drug-eluting balloons inflated to 8 to 10 atm for approximately 1 minute.  Follow-up imaging demonstrated a persistent 50% narrowing at the origin and a 7 mm x 40 mm life stent was deployed to cover this lesion.  A 7 mm x 100 mm Lutonix drug-eluting balloon was then used to dilate the SFA from its origin distally.  Inflation was to 8 atm for 1 minute.  Distal runoff was then reassessed the SFA is now widely patent with less than 10% residual stenosis and the distal runoff is preserved.  After review of these images the sheath is pulled into the right external iliac oblique of the common femoral is obtained and a Star close device deployed. There no immediate complications.   Findings:  The abdominal aorta is opacified with a bolus injection contrast.  The right renal is single and widely patent.  There is a normal nephrogram.  No evidence seen for a left renal artery or left nephrogram. The aorta itself has mild disease but no hemodynamically significant lesions. The common and external iliac arteries are widely patent bilaterally.  The left common femoral is widely patent as is the profunda femoris.  The SFA demonstrates a flush occlusion at its origin there is reconstitution of the above-knee popliteal at Hunter's canal.  Popliteal is otherwise diffusely diseased but without hemodynamically significant stenosis there is three-vessel runoff to the foot.  Following angioplasty and stent placement the SFA is now widely patent with less than 10% residual stenosis.  Three-vessel runoff is unchanged.      Summary: Successful recanalization left lower extremity for limb salvage                        Disposition: Patient was taken to the recovery room in stable condition having tolerated the procedure well.  [name], Latina Craver 12/17/2022,9:33 AM

## 2022-12-18 ENCOUNTER — Telehealth (INDEPENDENT_AMBULATORY_CARE_PROVIDER_SITE_OTHER): Payer: Self-pay

## 2022-12-18 ENCOUNTER — Other Ambulatory Visit (INDEPENDENT_AMBULATORY_CARE_PROVIDER_SITE_OTHER): Payer: Self-pay | Admitting: Nurse Practitioner

## 2022-12-18 ENCOUNTER — Telehealth: Payer: Self-pay

## 2022-12-18 ENCOUNTER — Encounter: Payer: Self-pay | Admitting: Vascular Surgery

## 2022-12-18 MED ORDER — HYDROCODONE-ACETAMINOPHEN 5-325 MG PO TABS: 1.0000 | ORAL_TABLET | Freq: Four times a day (QID) | ORAL | 0 refills | Status: AC | PRN

## 2022-12-18 NOTE — Telephone Encounter (Signed)
This is very normal as sometimes the arteries are stretched and there can be some achiness and soreness following the procedure.  You can try Tylenol to help it or if you feel like Tylenol has not been helpful we can send in something stronger.  But this can last for a week or so following angioplasty.

## 2022-12-18 NOTE — Telephone Encounter (Signed)
Patient called into [facility] this morning at 0855 stating he was having "leg pain" and tried calling the vein and vascular office and "no one is answering".  He states his procedure site in his groin, done, yesterday, is not having any issues, his leg is hurting.  I confirmed he was calling the correct phone number for vein and vascular at [phone] and he confirmed.  Recommended he call back and leave a message on the "nurse line".  He agreed to that recommendation.  I also called and left a message on the nurse line with his MRN and complaint of leg pain and to please call patient.

## 2022-12-18 NOTE — Telephone Encounter (Signed)
Patient called the [facility] V[facility] center with a complaint of pain in his leg.  I followed up with Phill and he states that he is having a dull pain in his left leg where the stents were placed. The pain has been there since meds wore off from his angio with [facility] yesterday.   Please advise

## 2022-12-18 NOTE — Telephone Encounter (Signed)
Patient notified

## 2022-12-20 ENCOUNTER — Telehealth (INDEPENDENT_AMBULATORY_CARE_PROVIDER_SITE_OTHER): Payer: Self-pay | Admitting: Vascular Surgery

## 2022-12-20 NOTE — Telephone Encounter (Signed)
He may be suffering from some reperfusion injuries.  This happens when you had a significant narrowing and now there is blood flow returning to the area.  This will cause some swelling and redness and it can worsen some of the pain you are having.  He tried doubling the pain medication.  We can also try to bring you in on Monday to make sure that the stent is still open that was placed.  We can have him come in with arterial duplex.

## 2022-12-20 NOTE — Telephone Encounter (Signed)
Patient had left le angio on 12/17/22

## 2022-12-20 NOTE — Telephone Encounter (Signed)
Patient made aware with medical recommendations and was informed that one of the receptionist will contact him to schedule his appointment.

## 2022-12-20 NOTE — Telephone Encounter (Signed)
receptionist contacted him to schedule his appointment. Pt has an appt on Monday

## 2022-12-20 NOTE — Telephone Encounter (Signed)
Patient called and LVM  stating that pain medication that was called in is no longer working for him. Stated that his leg is now swollen and a "deep red" requesting a call back.    Please call and advise

## 2022-12-22 NOTE — Progress Notes (Signed)
MRN : [id number]  Jeffrey Scott is a 67 y.o. (Oct 30, 1955) male who presents with chief complaint of check circulation.  History of Present Illness:   The patient returns to the office for followup and review status post angiogram with intervention on 12/17/2022.   Procedure: Percutaneous transluminal angioplasty and stent placement left superficial femoral artery.   The patient notes pain in his leg it seems to have gotten somewhat better.  Right now he feels that the pain is from the knee to the ankle.  He states it is better in the morning but as the day goes on and he is trying to walk it hurts more it also seems to be swelling.  No new ulcers or wounds have occurred since the last visit.  There have been no significant changes to the patient's overall health care.  No documented history of amaurosis fugax or recent TIA symptoms. There are no recent neurological changes noted. No documented history of DVT, PE or superficial thrombophlebitis. The patient denies recent episodes of angina or shortness of breath.   ABI's Rt=1.18 and Lt=1.14  (previous ABI's Rt=1.25 and Lt=0.56) Duplex US of the left lower extremity arterial system shows widely patent SFA stent.  No outpatient medications have been marked as taking for the 12/23/22 encounter (Appointment) with Jeffrey Scott, Jeffrey Craver, MD.    Past Medical History:  Diagnosis Date   History of chicken pox    History of measles     Past Surgical History:  Procedure Laterality Date   EYE SURGERY Left 1982   for Diplopia   LOWER EXTREMITY ANGIOGRAPHY Left 12/17/2022   Procedure: Lower Extremity Angiography;  Surgeon: Jeffrey Dills, MD;  Location: [facility];  Service: Cardiovascular;  Laterality: Left;   NASAL SEPTUM SURGERY  2000   [facility]   TONSILLECTOMY AND ADENOIDECTOMY      Social History Social History   Tobacco Use   Smoking status: Every Day    Packs/day: .5    Types:  Cigarettes    Start date: 1969   Smokeless tobacco: Never   Tobacco comments:    smoking up to 1 ppd since age 1  Vaping Use   Vaping Use: Never used  Substance Use Topics   Alcohol use: Yes    Alcohol/week: 0.0 standard drinks of alcohol    Comment: 1 beer each week   Drug use: No    Family History Family History  Problem Relation Age of Onset   Diabetes Mother    CAD Mother        s/p PTCA and stents   Hypertension Mother    Dementia Father    Diabetes Father    Hypertension Father     Allergies  Allergen Reactions   Niaspan  [Niacin Er]    Zyban  [Bupropion Hcl]    Zyrtec  [Cetirizine]     malaise and fatigue     REVIEW OF SYSTEMS (Negative unless checked)  Constitutional: [] Weight loss  [] Fever  [] Chills Cardiac: [] Chest pain   [] Chest pressure   [] Palpitations   [] Shortness of breath when laying flat   [] Shortness of breath with exertion. Vascular:  [x] Pain in legs with walking   [] Pain in legs at rest  [] History of DVT   [] Phlebitis   [] Swelling in legs   [] Varicose veins   [] Non-healing ulcers Pulmonary:   [] Uses home oxygen   [] Productive cough   []   Hemoptysis   [] Wheeze  [] COPD   [] Asthma Neurologic:  [] Dizziness   [] Seizures   [] History of stroke   [] History of TIA  [] Aphasia   [] Vissual changes   [] Weakness or numbness in arm   [] Weakness or numbness in leg Musculoskeletal:   [] Joint swelling   [] Joint pain   [] Low back pain Hematologic:  [] Easy bruising  [] Easy bleeding   [] Hypercoagulable state   [] Anemic Gastrointestinal:  [] Diarrhea   [] Vomiting  [] Gastroesophageal reflux/heartburn   [] Difficulty swallowing. Genitourinary:  [] Chronic kidney disease   [] Difficult urination  [] Frequent urination   [] Blood in urine Skin:  [] Rashes   [] Ulcers  Psychological:  [] History of anxiety   []  History of major depression.  Physical Examination  There were no vitals filed for this visit. There is no height or weight on file to calculate BMI. Gen: WD/WN,  NAD Head: [place]/AT, No temporalis wasting.  Ear/Nose/Throat: Hearing grossly intact, nares w/o erythema or drainage Eyes: PER, EOMI, sclera nonicteric.  Neck: Supple, no masses.  No bruit or JVD.  Pulmonary:  Good air movement, no audible wheezing, no use of accessory muscles.  Cardiac: RRR, normal S1, S2, no Murmurs. Vascular:  mild trophic changes, no open wounds Vessel Right Left  Radial Palpable Palpable  PT Palpable Trace Palpable  DP  Palpable Trace Palpable  Gastrointestinal: soft, non-distended. No guarding/no peritoneal signs.  Musculoskeletal: M/S 5/5 throughout.  No visible deformity.  Neurologic: CN 2-12 intact. Pain and light touch intact in extremities.  Symmetrical.  Speech is fluent. Motor exam as listed above. Psychiatric: Judgment intact, Mood & affect appropriate for pt's clinical situation. Dermatologic: No rashes or ulcers noted.  No changes consistent with cellulitis.   CBC Lab Results  Component Value Date   WBC 8.0 09/15/2020   HGB 16.3 09/15/2020   HCT 48.4 09/15/2020   MCV 92 09/15/2020   PLT 203 09/15/2020    BMET    Component Value Date/Time   NA 140 09/15/2020 1101   K 4.8 09/15/2020 1101   CL 103 09/15/2020 1101   CO2 21 09/15/2020 1101   GLUCOSE 100 (H) 09/15/2020 1101   BUN 15 12/17/2022 0707   BUN 14 09/15/2020 1101   CREATININE 1.35 (H) 12/17/2022 0707   CALCIUM 9.6 09/15/2020 1101   GFRNONAA 58 (L) 12/17/2022 0707   GFRAA 61 09/15/2020 1101   Estimated Creatinine Clearance: 63.1 mL/min (A) (by C-G formula based on SCr of 1.35 mg/dL (H)).  COAG No results found for: "INR", "PROTIME"  Radiology PERIPHERAL VASCULAR CATHETERIZATION  Result Date: 12/17/2022 See surgical note for result.  VAS [place] ABI WITH/WO TBI  Result Date: 12/04/2022  LOWER EXTREMITY DOPPLER STUDY Patient Name:  Jeffrey Scott  Date of Exam:   11/29/2022 Medical Rec #: [id number]              Accession #:    [id number] Date of Birth: [birth year]              Patient Gender: M Patient Age:   13 years Exam Location:  [facility] Procedure:      VAS [place] ABI WITH/WO TBI Referring Phys: Jeffrey Scott --------------------------------------------------------------------------------  Indications: Claudication, and peripheral artery disease. High Risk Factors: Current smoker.  Vascular Interventions: Left leg claudication one month. Performing Technologist: Hardie Lora RVT  Examination Guidelines: A complete evaluation includes at minimum, Doppler waveform signals and systolic blood pressure reading at the level of bilateral brachial, anterior tibial, and posterior tibial arteries, when vessel segments  are accessible. Bilateral testing is considered an integral part of a complete examination. Photoelectric Plethysmograph (PPG) waveforms and toe systolic pressure readings are included as required and additional duplex testing as needed. Limited examinations for reoccurring indications may be performed as noted.  ABI Findings: +---------+------------------+-----+---------+--------+ Right    Rt Pressure (mmHg)IndexWaveform Comment  +---------+------------------+-----+---------+--------+ Brachial 138                                      +---------+------------------+-----+---------+--------+ PTA      169               1.16 triphasic         +---------+------------------+-----+---------+--------+ DP       182               1.25 triphasic         +---------+------------------+-----+---------+--------+ Great Toe117               0.80                   +---------+------------------+-----+---------+--------+ +---------+------------------+-----+----------+-------+ Left     Lt Pressure (mmHg)IndexWaveform  Comment +---------+------------------+-----+----------+-------+ Brachial 146                                      +---------+------------------+-----+----------+-------+ PTA      78                0.53 monophasic         +---------+------------------+-----+----------+-------+ DP       82                0.56 monophasic        +---------+------------------+-----+----------+-------+ Great Toe0                 0.00                   +---------+------------------+-----+----------+-------+ +-------+-----------+-----------+------------+------------+ ABI/TBIToday's ABIToday's TBIPrevious ABIPrevious TBI +-------+-----------+-----------+------------+------------+ Right  1.25       0.80                                +-------+-----------+-----------+------------+------------+ Left   0.56       0.00                                +-------+-----------+-----------+------------+------------+  Limited imaging showed occluded left SFA. Follow up appt scheduled with provider.  Summary: Right: Resting right ankle-brachial index is within normal range. The right toe-brachial index is normal. Left: Resting left ankle-brachial index indicates moderate left lower extremity arterial disease. The left toe-brachial index is abnormal. Limited imaging showed left SFA occlusion. *See table(s) above for measurements and observations.  Electronically signed by Festus Barren MD on 12/04/2022 at 8:18:44 AM.    Final      Assessment/Plan 1. Atherosclerosis of native artery of both lower extremities with intermittent claudication (HCC) Recommend:  The patient is status post successful angiogram with intervention.  The patient reports that his leg pain has changed and is perhaps worse.   Noninvasive studies demonstrate that the SFA stent is widely patent and his ABI has now normalized with triphasic signals.  I believe this is reperfusion injury.  He does state that his  leg is feeling somewhat better and that his thigh is not hurting as much but seems to be more just below the knee.  No further invasive studies, angiography or surgery at this time The patient should continue walking and begin a more formal exercise program.  The  patient should continue antiplatelet therapy and aggressive treatment of the lipid abnormalities  Continued surveillance is indicated as atherosclerosis is likely to progress with time.    Patient should undergo noninvasive studies as ordered. The patient will follow up with me to review the studies.   2. Coronary artery disease involving native coronary artery of native heart without angina pectoris Continue cardiac and antihypertensive medications as already ordered and reviewed, no changes at this time.  Continue statin as ordered and reviewed, no changes at this time  Nitrates PRN for chest pain  3. Chronic obstructive pulmonary disease, unspecified COPD type (HCC) Continue pulmonary medications and aerosols as already ordered, these medications have been reviewed and there are no changes at this time.   4. Mixed hyperlipidemia Continue statin as ordered and reviewed, no changes at this time    Levora Dredge, MD  12/22/2022 1:27 PM

## 2022-12-23 ENCOUNTER — Ambulatory Visit (INDEPENDENT_AMBULATORY_CARE_PROVIDER_SITE_OTHER): Payer: Medicare Other

## 2022-12-23 ENCOUNTER — Ambulatory Visit (INDEPENDENT_AMBULATORY_CARE_PROVIDER_SITE_OTHER): Payer: Medicare Other | Admitting: Vascular Surgery

## 2022-12-23 ENCOUNTER — Other Ambulatory Visit (INDEPENDENT_AMBULATORY_CARE_PROVIDER_SITE_OTHER): Payer: Self-pay | Admitting: Vascular Surgery

## 2022-12-23 ENCOUNTER — Encounter (INDEPENDENT_AMBULATORY_CARE_PROVIDER_SITE_OTHER): Payer: Self-pay | Admitting: Vascular Surgery

## 2022-12-23 VITALS — BP 139/85 | HR 91 | Resp 18 | Ht 70.0 in | Wt 235.6 lb

## 2022-12-23 DIAGNOSIS — E782 Mixed hyperlipidemia: Secondary | ICD-10-CM

## 2022-12-23 DIAGNOSIS — Z9889 Other specified postprocedural states: Secondary | ICD-10-CM | POA: Diagnosis not present

## 2022-12-23 DIAGNOSIS — I2510 Atherosclerotic heart disease of native coronary artery without angina pectoris: Secondary | ICD-10-CM | POA: Diagnosis not present

## 2022-12-23 DIAGNOSIS — I739 Peripheral vascular disease, unspecified: Secondary | ICD-10-CM

## 2022-12-23 DIAGNOSIS — M79605 Pain in left leg: Secondary | ICD-10-CM | POA: Diagnosis not present

## 2022-12-23 DIAGNOSIS — J449 Chronic obstructive pulmonary disease, unspecified: Secondary | ICD-10-CM

## 2022-12-23 DIAGNOSIS — I70213 Atherosclerosis of native arteries of extremities with intermittent claudication, bilateral legs: Secondary | ICD-10-CM

## 2022-12-25 LAB — VAS US ABI WITH/WO TBI
Left ABI: 1.14
Right ABI: 1.18

## 2022-12-29 ENCOUNTER — Encounter (INDEPENDENT_AMBULATORY_CARE_PROVIDER_SITE_OTHER): Payer: Self-pay | Admitting: Vascular Surgery

## 2023-01-09 ENCOUNTER — Other Ambulatory Visit (INDEPENDENT_AMBULATORY_CARE_PROVIDER_SITE_OTHER): Payer: Self-pay | Admitting: Vascular Surgery

## 2023-01-09 DIAGNOSIS — Z9889 Other specified postprocedural states: Secondary | ICD-10-CM

## 2023-01-15 ENCOUNTER — Ambulatory Visit (INDEPENDENT_AMBULATORY_CARE_PROVIDER_SITE_OTHER): Payer: Medicare Other

## 2023-01-15 ENCOUNTER — Encounter (INDEPENDENT_AMBULATORY_CARE_PROVIDER_SITE_OTHER): Payer: Self-pay | Admitting: Nurse Practitioner

## 2023-01-15 ENCOUNTER — Ambulatory Visit (INDEPENDENT_AMBULATORY_CARE_PROVIDER_SITE_OTHER): Payer: Medicare Other | Admitting: Nurse Practitioner

## 2023-01-15 ENCOUNTER — Encounter (INDEPENDENT_AMBULATORY_CARE_PROVIDER_SITE_OTHER): Payer: Medicare Other

## 2023-01-15 VITALS — BP 110/80 | HR 85 | Resp 18 | Ht 70.0 in | Wt 232.0 lb

## 2023-01-15 DIAGNOSIS — Z9889 Other specified postprocedural states: Secondary | ICD-10-CM

## 2023-01-15 DIAGNOSIS — R0789 Other chest pain: Secondary | ICD-10-CM | POA: Diagnosis not present

## 2023-01-15 DIAGNOSIS — I739 Peripheral vascular disease, unspecified: Secondary | ICD-10-CM

## 2023-01-15 DIAGNOSIS — E782 Mixed hyperlipidemia: Secondary | ICD-10-CM | POA: Diagnosis not present

## 2023-01-15 DIAGNOSIS — F17200 Nicotine dependence, unspecified, uncomplicated: Secondary | ICD-10-CM | POA: Diagnosis not present

## 2023-01-15 NOTE — Progress Notes (Signed)
Subjective:    Patient ID: Jeffrey Scott, male    DOB: 1956-02-28, 67 y.o.   MRN: [id number] Chief Complaint  Patient presents with  . Follow-up    3 week follow up with ABI    HPI  Review of Systems     Objective:   Physical Exam  BP 110/80 (BP Location: Left Arm)   Pulse 85   Resp 18   Ht 5\' 10"  (1.778 m)   Wt 232 lb (105.2 kg)   BMI 33.29 kg/m   Past Medical History:  Diagnosis Date  . History of chicken pox   . History of measles     Social History   Socioeconomic History  . Marital status: Married    Spouse name: Jeffrey Scott  . Number of children: 2  . Years of education: Not on file  . Highest education level: Not on file  Occupational History  . Occupation: Theatre stage manager    Comment: Retired  April 2020  Tobacco Use  . Smoking status: Every Day    Packs/day: .5    Types: Cigarettes    Start date: 71  . Smokeless tobacco: Never  . Tobacco comments:    smoking up to 1 ppd since age 43  Vaping Use  . Vaping Use: Never used  Substance and Sexual Activity  . Alcohol use: Yes    Alcohol/week: 0.0 standard drinks of alcohol    Comment: 1 beer each week  . Drug use: No  . Sexual activity: Not on file  Other Topics Concern  . Not on file  Social History Narrative   Lives with wife, Jeffrey Scott. Outdoor cat.    Social Determinants of Health   Financial Resource Strain: Not on file  Food Insecurity: Not on file  Transportation Needs: Not on file  Physical Activity: Not on file  Stress: Not on file  Social Connections: Not on file  Intimate Partner Violence: Not on file    Past Surgical History:  Procedure Laterality Date  . EYE SURGERY Left 1982   for Diplopia  . LOWER EXTREMITY ANGIOGRAPHY Left 12/17/2022   Procedure: Lower Extremity Angiography;  Surgeon: Renford Dills, MD;  Location: [facility];  Service: Cardiovascular;  Laterality: Left;  . NASAL SEPTUM SURGERY  2000    [facility]  . TONSILLECTOMY AND  ADENOIDECTOMY      Family History  Problem Relation Age of Onset  . Diabetes Mother   . CAD Mother        s/p PTCA and stents  . Hypertension Mother   . Dementia Father   . Diabetes Father   . Hypertension Father     Allergies  Allergen Reactions  . Niaspan  [Niacin Er]   . Zyban  [Bupropion Hcl]   . Zyrtec  [Cetirizine]     malaise and fatigue       Latest Ref Rng & Units 09/15/2020   11:01 AM 03/13/2018    9:52 AM  CBC  WBC 3.4 - 10.8 x10E3/uL 8.0  8.1   Hemoglobin 13.0 - 17.7 g/dL 78.2  95.6   Hematocrit 37.5 - 51.0 % 48.4  41.5   Platelets 150 - 450 x10E3/uL 203  210       CMP     Component Value Date/Time   NA 140 09/15/2020 1101   K 4.8 09/15/2020 1101   CL 103 09/15/2020 1101   CO2 21 09/15/2020 1101   GLUCOSE 100 (H) 09/15/2020 1101  BUN 15 12/17/2022 0707   BUN 14 09/15/2020 1101   CREATININE 1.35 (H) 12/17/2022 0707   CALCIUM 9.6 09/15/2020 1101   PROT 6.7 09/15/2020 1101   ALBUMIN 4.4 09/15/2020 1101   AST 15 09/15/2020 1101   ALT 21 09/15/2020 1101   ALKPHOS 68 09/15/2020 1101   BILITOT 0.4 09/15/2020 1101   GFRNONAA 58 (L) 12/17/2022 0707   GFRAA 61 09/15/2020 1101     VAS [place] ABI WITH/WO TBI  Result Date: 12/25/2022  LOWER EXTREMITY DOPPLER STUDY Patient Name:  [name]  Date of Exam:   12/23/2022 Medical Rec #: [id number]              Accession #:    [id number] Date of Birth: 1956/07/29             Patient Gender: M Patient Age:   3 years Exam Location:  [facility] Procedure:      VAS [place] ABI WITH/WO TBI Referring Phys: --------------------------------------------------------------------------------  Indications: Claudication, and peripheral artery disease.  Vascular Interventions: 12/17/2022 Percutaneous transluminal angioplasty and                         stent placement left superficial femoral artery. Comparison Study: 12/04/2022 Performing Technologist: Salvadore Farber RVT  Examination Guidelines: A complete evaluation  includes at minimum, Doppler waveform signals and systolic blood pressure reading at the level of bilateral brachial, anterior tibial, and posterior tibial arteries, when vessel segments are accessible. Bilateral testing is considered an integral part of a complete examination. Photoelectric Plethysmograph (PPG) waveforms and toe systolic pressure readings are included as required and additional duplex testing as needed. Limited examinations for reoccurring indications may be performed as noted.  ABI Findings: +---------+------------------+-----+---------+--------+ Right    Rt Pressure (mmHg)IndexWaveform Comment  +---------+------------------+-----+---------+--------+ Brachial 134                                      +---------+------------------+-----+---------+--------+ ATA      169               1.26 triphasic         +---------+------------------+-----+---------+--------+ PTA      158               1.18 triphasic         +---------+------------------+-----+---------+--------+ Great Toe157               1.17 Normal            +---------+------------------+-----+---------+--------+ +---------+------------------+-----+---------+-------+ Left     Lt Pressure (mmHg)IndexWaveform Comment +---------+------------------+-----+---------+-------+ Brachial 130                                     +---------+------------------+-----+---------+-------+ ATA      153               1.14 triphasic        +---------+------------------+-----+---------+-------+ PTA      145               1.08 triphasic        +---------+------------------+-----+---------+-------+ Great Toe158               1.18 Normal           +---------+------------------+-----+---------+-------+ +-------+-----------+-----------+------------+------------+ ABI/TBIToday's ABIToday's TBIPrevious ABIPrevious TBI +-------+-----------+-----------+------------+------------+ Right  1.18  1.17        1.25        .80          +-------+-----------+-----------+------------+------------+ Left   1.14       1.18       .56         00           +-------+-----------+-----------+------------+------------+ Left ABIs and TBIs appear increased compared to prior study on 12/04/2022.  Summary: Right: Resting right ankle-brachial index is within normal range. The right toe-brachial index is normal. Left: Resting left ankle-brachial index is within normal range. The left toe-brachial index is normal. *See table(s) above for measurements and observations.  Electronically signed by Levora Dredge MD on 12/25/2022 at 10:13:28 AM.    Final    VAS [place] ABI WITH/WO TBI  Result Date: 12/04/2022  LOWER EXTREMITY DOPPLER STUDY Patient Name:  Torran Whidby  Date of Exam:   11/29/2022 Medical Rec #: [id number]              Accession #:    [id number] Date of Birth: [birth year]             Patient Gender: M Patient Age:   21 years Exam Location:  [facility] Procedure:      VAS [place] ABI WITH/WO TBI Referring Phys: Rosetta Posner --------------------------------------------------------------------------------  Indications: Claudication, and peripheral artery disease. High Risk Factors: Current smoker.  Vascular Interventions: Left leg claudication one month. Performing Technologist: Hardie Lora RVT  Examination Guidelines: A complete evaluation includes at minimum, Doppler waveform signals and systolic blood pressure reading at the level of bilateral brachial, anterior tibial, and posterior tibial arteries, when vessel segments are accessible. Bilateral testing is considered an integral part of a complete examination. Photoelectric Plethysmograph (PPG) waveforms and toe systolic pressure readings are included as required and additional duplex testing as needed. Limited examinations for reoccurring indications may be performed as noted.  ABI Findings: +---------+------------------+-----+---------+--------+ Right     Rt Pressure (mmHg)IndexWaveform Comment  +---------+------------------+-----+---------+--------+ Brachial 138                                      +---------+------------------+-----+---------+--------+ PTA      169               1.16 triphasic         +---------+------------------+-----+---------+--------+ DP       182               1.25 triphasic         +---------+------------------+-----+---------+--------+ Great Toe117               0.80                   +---------+------------------+-----+---------+--------+ +---------+------------------+-----+----------+-------+ Left     Lt Pressure (mmHg)IndexWaveform  Comment +---------+------------------+-----+----------+-------+ Brachial 146                                      +---------+------------------+-----+----------+-------+ PTA      78                0.53 monophasic        +---------+------------------+-----+----------+-------+ DP       82                0.56 monophasic        +---------+------------------+-----+----------+-------+  Great Toe0                 0.00                   +---------+------------------+-----+----------+-------+ +-------+-----------+-----------+------------+------------+ ABI/TBIToday's ABIToday's TBIPrevious ABIPrevious TBI +-------+-----------+-----------+------------+------------+ Right  1.25       0.80                                +-------+-----------+-----------+------------+------------+ Left   0.56       0.00                                +-------+-----------+-----------+------------+------------+  Limited imaging showed occluded left SFA. Follow up appt scheduled with provider.  Summary: Right: Resting right ankle-brachial index is within normal range. The right toe-brachial index is normal. Left: Resting left ankle-brachial index indicates moderate left lower extremity arterial disease. The left toe-brachial index is abnormal. Limited imaging showed  left SFA occlusion. *See table(s) above for measurements and observations.  Electronically signed by Festus Barren MD on 12/04/2022 at 8:18:44 AM.    Final        Assessment & Plan:   1. Peripheral arterial disease with history of revascularization (HCC) ***  2. Chest pressure *** - Ambulatory referral to Cardiology  3. Mixed hyperlipidemia ***   Current Outpatient Medications on File Prior to Visit  Medication Sig Dispense Refill  . albuterol (VENTOLIN HFA) 108 (90 Base) MCG/ACT inhaler Inhale into the lungs.    Marland Kitchen aspirin 81 MG tablet Take 1 tablet by mouth daily.    . clopidogrel (PLAVIX) 75 MG tablet Take 1 tablet (75 mg total) by mouth daily. 30 tablet 11  . HYDROcodone-acetaminophen (NORCO/VICODIN) 5-325 MG tablet Take 1 tablet by mouth every 6 (six) hours as needed for moderate pain. 20 tablet 0  . oxymetazoline (NASAL RELIEF) 0.05 % nasal spray Place into the nose.    Marland Kitchen Oxymetazoline HCl-Menthol 0.05 % SOLN as needed.    . rosuvastatin (CRESTOR) 20 MG tablet Take 20 mg by mouth daily.    Marland Kitchen STIOLTO RESPIMAT 2.5-2.5 MCG/ACT AERS Inhale 2 puffs into the lungs daily. 4 g 3   No current facility-administered medications on file prior to visit.    There are no Patient Instructions on file for this visit. No follow-ups on file.   Georgiana Spinner, NP

## 2023-01-16 LAB — VAS US ABI WITH/WO TBI
Left ABI: 1.22
Right ABI: 1.17

## 2023-02-10 ENCOUNTER — Other Ambulatory Visit: Payer: Self-pay | Admitting: Internal Medicine

## 2023-02-10 DIAGNOSIS — R079 Chest pain, unspecified: Secondary | ICD-10-CM

## 2023-02-10 DIAGNOSIS — R0602 Shortness of breath: Secondary | ICD-10-CM

## 2023-02-10 DIAGNOSIS — R0789 Other chest pain: Secondary | ICD-10-CM

## 2023-03-10 ENCOUNTER — Encounter (HOSPITAL_COMMUNITY): Payer: Self-pay

## 2023-03-10 ENCOUNTER — Other Ambulatory Visit (HOSPITAL_COMMUNITY): Payer: Self-pay | Admitting: Emergency Medicine

## 2023-03-10 DIAGNOSIS — R079 Chest pain, unspecified: Secondary | ICD-10-CM

## 2023-03-10 MED ORDER — METOPROLOL TARTRATE 100 MG PO TABS
100.0000 mg | ORAL_TABLET | Freq: Once | ORAL | 0 refills | Status: DC
Start: 2023-03-10 — End: 2024-03-18

## 2023-03-12 ENCOUNTER — Ambulatory Visit
Admission: RE | Admit: 2023-03-12 | Discharge: 2023-03-12 | Disposition: A | Discharge status: Home / Self Care | Payer: Medicare Other | Source: Ambulatory Visit | Attending: Internal Medicine | Admitting: Internal Medicine

## 2023-03-12 DIAGNOSIS — R0602 Shortness of breath: Secondary | ICD-10-CM | POA: Diagnosis present

## 2023-03-12 DIAGNOSIS — R079 Chest pain, unspecified: Secondary | ICD-10-CM | POA: Insufficient documentation

## 2023-03-12 DIAGNOSIS — R0789 Other chest pain: Secondary | ICD-10-CM | POA: Insufficient documentation

## 2023-03-12 DIAGNOSIS — I2510 Atherosclerotic heart disease of native coronary artery without angina pectoris: Secondary | ICD-10-CM | POA: Diagnosis not present

## 2023-03-12 LAB — POCT I-STAT CREATININE: Creatinine, Ser: 1.7 mg/dL — ABNORMAL HIGH (ref 0.61–1.24)

## 2023-03-12 MED ORDER — NITROGLYCERIN 0.4 MG SL SUBL
0.8000 mg | SUBLINGUAL_TABLET | Freq: Once | SUBLINGUAL | Status: AC
  Administered 2023-03-12: 0.8 mg via SUBLINGUAL
  Filled 2023-03-12: qty 25

## 2023-03-12 MED ORDER — IOHEXOL 350 MG/ML SOLN
80.0000 mL | Freq: Once | INTRAVENOUS | Status: AC | PRN
  Administered 2023-03-12: 80 mL via INTRAVENOUS

## 2023-03-12 NOTE — Progress Notes (Signed)

## 2023-04-11 ENCOUNTER — Other Ambulatory Visit (INDEPENDENT_AMBULATORY_CARE_PROVIDER_SITE_OTHER): Payer: Self-pay | Admitting: Nurse Practitioner

## 2023-04-11 DIAGNOSIS — Z9889 Other specified postprocedural states: Secondary | ICD-10-CM

## 2023-04-17 ENCOUNTER — Encounter (INDEPENDENT_AMBULATORY_CARE_PROVIDER_SITE_OTHER): Payer: Self-pay | Admitting: Nurse Practitioner

## 2023-04-17 ENCOUNTER — Ambulatory Visit (INDEPENDENT_AMBULATORY_CARE_PROVIDER_SITE_OTHER): Payer: Medicare Other | Admitting: Nurse Practitioner

## 2023-04-17 ENCOUNTER — Ambulatory Visit (INDEPENDENT_AMBULATORY_CARE_PROVIDER_SITE_OTHER): Payer: Medicare Other

## 2023-04-17 VITALS — BP 124/82 | HR 81 | Resp 18 | Ht 70.0 in | Wt 230.0 lb

## 2023-04-17 DIAGNOSIS — I739 Peripheral vascular disease, unspecified: Secondary | ICD-10-CM

## 2023-04-17 DIAGNOSIS — F17200 Nicotine dependence, unspecified, uncomplicated: Secondary | ICD-10-CM

## 2023-04-17 DIAGNOSIS — Z9889 Other specified postprocedural states: Secondary | ICD-10-CM | POA: Diagnosis not present

## 2023-04-17 DIAGNOSIS — E782 Mixed hyperlipidemia: Secondary | ICD-10-CM

## 2023-04-18 NOTE — Progress Notes (Signed)
Subjective:    Patient ID: Jeffrey Scott, male    DOB: [birth year], 67 y.o.   MRN: [id number] Chief Complaint  Patient presents with   Follow-up    3 month follow up with ABI & BIL art dupl    The patient returns to the office for followup and review of the noninvasive studies.  He underwent intervention on 12/17/2022 of the left leg  There have been no interval changes in lower extremity symptoms. No interval shortening of the patient's claudication distance or development of rest pain symptoms. No new ulcers or wounds have occurred since the last visit.  There have been no significant changes to the patient's overall health care.  The patient denies amaurosis fugax or recent TIA symptoms. There are no documented recent neurological changes noted. There is no history of DVT, PE or superficial thrombophlebitis. The patient denies recent episodes of angina or shortness of breath.   ABI Rt=1.23 and Lt=1.14  (previous ABI's Rt=1.17 and Lt=1.23) Duplex ultrasound of the bilateral tibial vessels shows triphasic waveforms    Review of Systems  Cardiovascular:  Positive for leg swelling.  All other systems reviewed and are negative.      Objective:   Physical Exam Vitals reviewed.  HENT:     Head: Normocephalic.  Neck:     Vascular: No carotid bruit.  Cardiovascular:     Rate and Rhythm: Normal rate.     Pulses:          Radial pulses are 2+ on the right side and 2+ on the left side.       Dorsalis pedis pulses are detected w/ Doppler on the right side and detected w/ Doppler on the left side.       Posterior tibial pulses are detected w/ Doppler on the right side and detected w/ Doppler on the left side.  Pulmonary:     Effort: Pulmonary effort is normal.  Skin:    General: Skin is warm and dry.  Neurological:     Mental Status: He is alert and oriented to person, place, and time.  Psychiatric:        Mood and Affect: Mood normal.        Behavior: Behavior  normal.        Thought Content: Thought content normal.        Judgment: Judgment normal.     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 Outdoor cat.    Social Determinants of Health   Financial Resource Strain: Not on file  Food Insecurity: Not on file  Transportation Needs: Not on file  Physical Activity: Not on file  Stress: Not on file  Social Connections: Not on file  Intimate Partner Violence: Not on file    Past Surgical History:  Procedure Laterality Date   EYE SURGERY Left 1982   for Diplopia   LOWER EXTREMITY ANGIOGRAPHY Left 12/17/2022   Procedure: Lower Extremity Angiography;  Surgeon: Renford Dills, MD;  Location: [facility]IVE [facility];  Service: Cardiovascular;  Laterality: Left;   NASAL SEPTUM SURGERY  2000    ENT   TONSILLECTOMY AND ADENOIDECTOMY      Family History  Problem Relation Age of Onset   Diabetes Mother    CAD Mother         s/p PTCA and stents   Hypertension Mother    Dementia Father    Diabetes Father    Hypertension Father     Allergies  Allergen Reactions   Niaspan  [Niacin Er]    Zyban  [Bupropion Hcl]    Zyrtec  [Cetirizine]     malaise and fatigue       Latest Ref Rng & Units 09/15/2020   11:01 AM 03/13/2018    9:52 AM  CBC  WBC 3.4 - 10.8 x10E3/uL 8.0  8.1   Hemoglobin 13.0 - 17.7 g/dL 16.1  09.6   Hematocrit 37.5 - 51.0 % 48.4  41.5   Platelets 150 - 450 x10E3/uL 203  210       CMP     Component Value Date/Time   NA 140 09/15/2020 1101   K 4.8 09/15/2020 1101   CL 103 09/15/2020 1101   CO2 21 09/15/2020 1101   GLUCOSE 100 (H) 09/15/2020 1101   BUN 15 12/17/2022 0707   BUN 14 09/15/2020 1101   CREATININE 1.70 (H) 03/12/2023 0817   CALCIUM 9.6 09/15/2020 1101   PROT 6.7 09/15/2020 1101   ALBUMIN 4.4 09/15/2020 1101   AST 15 09/15/2020 1101   ALT 21 09/15/2020 1101   ALKPHOS 68 09/15/2020 1101   BILITOT 0.4 09/15/2020 1101   GFRNONAA 58 (L) 12/17/2022 0707     No results found.     Assessment & Plan:   1. Peripheral arterial disease with history of revascularization (HCC)  Recommend:  The patient has evidence of atherosclerosis of the lower extremities with no claudication.  The patient does not voice lifestyle limiting changes at this point in time.  Noninvasive studies do not suggest clinically significant change.  No invasive studies, angiography or surgery at this time The patient should continue walking and begin a more formal exercise program.  The patient should continue antiplatelet therapy and aggressive treatment of the lipid abnormalities  No changes in the patient's medications at this time  Continued surveillance is indicated as atherosclerosis is likely to progress with time.    The patient will continue follow up with noninvasive studies as ordered.   2. Mixed hyperlipidemia Continue statin as ordered and reviewed, no changes at this  time  3. Tobacco use disorder Smoking cessation was discussed, 3-10 minutes spent  on this topic specifically   Current Outpatient Medications on File Prior to Visit  Medication Sig Dispense Refill   albuterol (VENTOLIN HFA) 108 (90 Base) MCG/ACT inhaler Inhale into the lungs.     aspirin 81 MG tablet Take 1 tablet by mouth daily.     clopidogrel (PLAVIX) 75 MG tablet Take 1 tablet (75 mg total) by mouth daily. 30 tablet 11   HYDROcodone-acetaminophen (NORCO/VICODIN) 5-325 MG tablet Take 1 tablet by mouth every 6 (six) hours as needed for moderate pain. 20 tablet 0   metoprolol tartrate (LOPRESSOR) 100 MG tablet Take 1 tablet (100 mg total) by mouth once for 1 dose. Take 90-120 minutes prior to scan. Hold for SBP less than 110. 1 tablet 0   oxymetazoline (NASAL RELIEF) 0.05 % nasal spray Place into the nose.     Oxymetazoline HCl-Menthol 0.05 % SOLN as needed.     rosuvastatin (CRESTOR) 20 MG tablet Take 20 mg by mouth daily.     STIOLTO RESPIMAT 2.5-2.5 MCG/ACT AERS Inhale 2 puffs into the lungs daily. 4 g 3   No current facility-administered medications on file prior to visit.    There are no Patient Instructions on file for this visit. No follow-ups on file.   Georgiana Spinner, NP

## 2023-04-24 LAB — VAS US ABI WITH/WO TBI
Left ABI: 1.14
Right ABI: 1.23

## 2023-09-29 ENCOUNTER — Other Ambulatory Visit (INDEPENDENT_AMBULATORY_CARE_PROVIDER_SITE_OTHER): Payer: Self-pay | Admitting: Vascular Surgery

## 2023-10-16 ENCOUNTER — Other Ambulatory Visit (INDEPENDENT_AMBULATORY_CARE_PROVIDER_SITE_OTHER): Payer: Self-pay | Admitting: Vascular Surgery

## 2023-10-16 DIAGNOSIS — Z9889 Other specified postprocedural states: Secondary | ICD-10-CM

## 2023-10-17 ENCOUNTER — Ambulatory Visit (INDEPENDENT_AMBULATORY_CARE_PROVIDER_SITE_OTHER): Payer: Medicare Other | Admitting: Nurse Practitioner

## 2023-10-17 ENCOUNTER — Ambulatory Visit (INDEPENDENT_AMBULATORY_CARE_PROVIDER_SITE_OTHER): Payer: Medicare Other

## 2023-10-17 ENCOUNTER — Encounter (INDEPENDENT_AMBULATORY_CARE_PROVIDER_SITE_OTHER): Payer: Self-pay | Admitting: Nurse Practitioner

## 2023-10-17 VITALS — BP 127/84 | HR 69 | Resp 16 | Wt 237.0 lb

## 2023-10-17 DIAGNOSIS — M79605 Pain in left leg: Secondary | ICD-10-CM | POA: Diagnosis not present

## 2023-10-17 DIAGNOSIS — I739 Peripheral vascular disease, unspecified: Secondary | ICD-10-CM

## 2023-10-17 DIAGNOSIS — F17200 Nicotine dependence, unspecified, uncomplicated: Secondary | ICD-10-CM

## 2023-10-17 DIAGNOSIS — E782 Mixed hyperlipidemia: Secondary | ICD-10-CM

## 2023-10-17 DIAGNOSIS — Z9889 Other specified postprocedural states: Secondary | ICD-10-CM

## 2023-10-17 MED ORDER — CLOPIDOGREL BISULFATE 75 MG PO TABS: 75.0000 mg | ORAL_TABLET | Freq: Every day | ORAL | 11 refills | Status: AC

## 2023-10-17 NOTE — Progress Notes (Signed)
 Subjective:    Patient ID: Jeffrey Scott, male    DOB: 1956-06-06, 68 y.o.   MRN: [id number] Chief Complaint  Patient presents with   Follow-up    6 month abi follow up    The patient returns to the office for followup regarding atherosclerotic changes of the lower extremities and review of the noninvasive studies.   The patient notes that he has been having some claudication-like symptoms in the left lower extremity.  It does not happen every time that he is active but he has had a family member hospitalized and notes that walking from the parking lot to the hospital itself he has been having some calf claudication which was similar to what he had prior to his left lower extremity intervention.  He denies any swelling present.  He also notes that in the left leg he has limited range of motion and mobility in the ankle area.  There have been no significant changes to the patient's overall health care.  The patient denies amaurosis fugax or recent TIA symptoms. There are no documented recent neurological changes noted. There is no history of DVT, PE or superficial thrombophlebitis. The patient denies recent episodes of angina or shortness of breath.   ABI Rt=1.26 and Lt=1.13  (previous ABI's Rt=1.23 and Lt=1.14) Duplex ultrasound of the bilateral tibial vessels reveals multiphasic waveforms with normal toe waveforms bilaterally.  Additionally the patient had toes and exercise for 2 minutes and ABIs were repeated.  Following exercise the right ABI was 1.14 and the left was 1.07.  Additionally bedside duplex of the patient's venous system was performed and no DVT was detected or evidence of any significant venous stenosis.    Review of Systems  Cardiovascular:  Negative for leg swelling.  Musculoskeletal:  Positive for arthralgias and gait problem.  All other systems reviewed and are negative.      Objective:   Physical Exam Vitals reviewed.  HENT:     Head:  Normocephalic.  Cardiovascular:     Rate and Rhythm: Normal rate.     Pulses:          Dorsalis pedis pulses are detected w/ Doppler on the right side and detected w/ Doppler on the left side.       Posterior tibial pulses are detected w/ Doppler on the right side and detected w/ Doppler on the left side.  Pulmonary:     Effort: Pulmonary effort is normal.  Musculoskeletal:     Left lower leg: No edema.  Skin:    General: Skin is warm and dry.  Neurological:     Mental Status: He is alert and oriented to person, place, and time.  Psychiatric:        Mood and Affect: Mood normal.        Behavior: Behavior normal.        Thought Content: Thought content normal.        Judgment: Judgment normal.     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 Outdoor cat.    Social Drivers of Corporate investment banker Strain: Not on file  Food Insecurity: Not on file  Transportation Needs: Not on file  Physical Activity: Not  on file  Stress: Not on file  Social Connections: Not on file  Intimate Partner Violence: Not on file    Past Surgical History:  Procedure Laterality Date   EYE SURGERY Left 1982   for Diplopia   LOWER EXTREMITY ANGIOGRAPHY Left 12/17/2022   Procedure: Lower Extremity Angiography;  Surgeon: Renford Dills, MD;  Location: [facility] INV[facility];  Service: Cardiovascular;  Laterality: Left;   NASAL SEPTUM SURGERY  2000   [facility]   TONSILLECTOMY AND ADENOIDECTOMY      Family History  Problem Relation Age of Onset   Diabetes Mother    CAD Mother        s/p PTCA and stents   Hypertension Mother    Dementia Father    Diabetes Father    Hypertension Father     Allergies  Allergen Reactions   Niaspan  [Niacin Er (Antihyperlipidemic)]    Zyban  [Bupropion Hcl]    Zyrtec  [Cetirizine]     malaise and fatigue       Latest Ref Rng & Units 09/15/2020   11:01 AM 03/13/2018    9:52 AM  CBC  WBC 3.4 - 10.8 x10E3/uL 8.0  8.1   Hemoglobin 13.0 - 17.7 g/dL 59.5  63.8   Hematocrit 37.5 - 51.0 % 48.4  41.5   Platelets 150 - 450 x10E3/uL 203  210       CMP     Component Value Date/Time   NA 140 09/15/2020 1101   K 4.8 09/15/2020 1101   CL 103 09/15/2020 1101   CO2 21 09/15/2020 1101   GLUCOSE 100 (H) 09/15/2020 1101   BUN 15 12/17/2022 0707   BUN 14 09/15/2020 1101   CREATININE 1.70 (H) 03/12/2023 0817   CALCIUM 9.6 09/15/2020 1101   PROT 6.7 09/15/2020 1101   ALBUMIN 4.4 09/15/2020 1101   AST 15 09/15/2020 1101   ALT 21 09/15/2020 1101   ALKPHOS 68 09/15/2020 1101   BILITOT 0.4 09/15/2020 1101   GFRNONAA 58 (L) 12/17/2022 0707     No results found.     Assessment & Plan:   1. Peripheral arterial disease with history of revascularization (HCC) (Primary)  Recommend:  The patient does not voice lifestyle limiting changes at this point in time.  Noninvasive studies do not suggest clinically significant change.  ABIs are normal bilaterally and showed no  significant change with activities as indicated by toe lift ABIs.  No invasive studies, angiography or surgery at  this time The patient should continue walking and begin a more formal exercise program.  The patient should continue antiplatelet therapy and aggressive treatment of the lipid abnormalities  No changes in the patient's medications at this time  Continued surveillance is indicated as atherosclerosis is likely to progress with time.    The patient will continue follow up with noninvasive studies as ordered.   2. Tobacco use disorder Smoking cessation was discussed, 3-10 minutes spent on this topic specifically  3. Mixed hyperlipidemia Continue statin as ordered and reviewed, no changes at this time  4. Left leg pain I suspect that the pain is more so musculoskeletal in nature.  He has decreased range of motion in the left ankle area.  Have advised him to follow-up with podiatry for evaluation of the ankle area.  Otherwise he is to follow with PCP.  He was ruled out for a left lower extremity DVT by bedside ultrasound   Current Outpatient Medications on File Prior to Visit  Medication Sig Dispense Refill   albuterol (VENTOLIN HFA) 108 (90 Base) MCG/ACT inhaler Inhale into the lungs.     aspirin 81 MG tablet Take 1 tablet by mouth daily.     clopidogrel (PLAVIX) 75 MG tablet Take 1 tablet (75 mg total) by mouth daily. 30 tablet 2   HYDROcodone-acetaminophen (NORCO/VICODIN) 5-325 MG tablet Take 1 tablet by mouth every 6 (six) hours as needed for moderate pain. 20 tablet 0   metoprolol tartrate (LOPRESSOR) 100 MG tablet Take 1 tablet (100 mg total) by mouth once for 1 dose. Take 90-120 minutes prior to scan. Hold for SBP less than 110. 1 tablet 0   oxymetazoline (NASAL RELIEF) 0.05 % nasal spray Place into the nose.     Oxymetazoline HCl-Menthol 0.05 % SOLN as needed.     rosuvastatin (CRESTOR) 20 MG tablet Take 20 mg by mouth daily.     STIOLTO RESPIMAT 2.5-2.5 MCG/ACT AERS  Inhale 2 puffs into the lungs daily. 4 g 3   No current facility-administered medications on file prior to visit.    There are no Patient Instructions on file for this visit. No follow-ups on file.   Georgiana Spinner, NP

## 2023-10-20 LAB — VAS US ABI WITH/WO TBI
Left ABI: 1.13
Right ABI: 1.26

## 2024-01-06 ENCOUNTER — Encounter (INDEPENDENT_AMBULATORY_CARE_PROVIDER_SITE_OTHER): Payer: Self-pay

## 2024-01-23 LAB — COLOGUARD: COLOGUARD: POSITIVE — AB

## 2024-03-18 ENCOUNTER — Ambulatory Visit
Admission: RE | Admit: 2024-03-18 | Discharge: 2024-03-18 | Disposition: A | Discharge status: Home / Self Care | Attending: Gastroenterology | Admitting: Gastroenterology

## 2024-03-18 ENCOUNTER — Other Ambulatory Visit: Payer: Self-pay

## 2024-03-18 ENCOUNTER — Encounter
Admission: RE | Disposition: A | Discharge status: Home / Self Care | Payer: Self-pay | Source: Home / Self Care | Attending: Gastroenterology

## 2024-03-18 ENCOUNTER — Encounter: Payer: Self-pay | Admitting: Gastroenterology

## 2024-03-18 ENCOUNTER — Ambulatory Visit: Admitting: Certified Registered"

## 2024-03-18 DIAGNOSIS — Z6831 Body mass index (BMI) 31.0-31.9, adult: Secondary | ICD-10-CM | POA: Diagnosis not present

## 2024-03-18 DIAGNOSIS — D124 Benign neoplasm of descending colon: Secondary | ICD-10-CM | POA: Insufficient documentation

## 2024-03-18 DIAGNOSIS — D123 Benign neoplasm of transverse colon: Secondary | ICD-10-CM | POA: Insufficient documentation

## 2024-03-18 DIAGNOSIS — I2510 Atherosclerotic heart disease of native coronary artery without angina pectoris: Secondary | ICD-10-CM | POA: Insufficient documentation

## 2024-03-18 DIAGNOSIS — C20 Malignant neoplasm of rectum: Secondary | ICD-10-CM | POA: Diagnosis not present

## 2024-03-18 DIAGNOSIS — J449 Chronic obstructive pulmonary disease, unspecified: Secondary | ICD-10-CM | POA: Diagnosis not present

## 2024-03-18 DIAGNOSIS — F17200 Nicotine dependence, unspecified, uncomplicated: Secondary | ICD-10-CM | POA: Insufficient documentation

## 2024-03-18 DIAGNOSIS — E66813 Obesity, class 3: Secondary | ICD-10-CM | POA: Diagnosis not present

## 2024-03-18 DIAGNOSIS — R195 Other fecal abnormalities: Secondary | ICD-10-CM | POA: Insufficient documentation

## 2024-03-18 DIAGNOSIS — Z1211 Encounter for screening for malignant neoplasm of colon: Secondary | ICD-10-CM | POA: Diagnosis present

## 2024-03-18 DIAGNOSIS — K644 Residual hemorrhoidal skin tags: Secondary | ICD-10-CM | POA: Insufficient documentation

## 2024-03-18 DIAGNOSIS — D120 Benign neoplasm of cecum: Secondary | ICD-10-CM | POA: Diagnosis not present

## 2024-03-18 HISTORY — PX: HEMOSTASIS CLIP PLACEMENT: SHX6857

## 2024-03-18 HISTORY — PX: POLYPECTOMY: SHX149

## 2024-03-18 HISTORY — DX: Chronic obstructive pulmonary disease, unspecified: J44.9

## 2024-03-18 HISTORY — PX: COLONOSCOPY: SHX5424

## 2024-03-18 HISTORY — PX: SUBMUCOSAL INJECTION: SHX5543

## 2024-03-18 SURGERY — COLONOSCOPY
Anesthesia: General

## 2024-03-18 MED ORDER — LIDOCAINE HCL (CARDIAC) PF 100 MG/5ML IV SOSY
PREFILLED_SYRINGE | INTRAVENOUS | Status: DC | PRN
  Administered 2024-03-18: 50 mg via INTRAVENOUS

## 2024-03-18 MED ORDER — PROPOFOL 1000 MG/100ML IV EMUL
INTRAVENOUS | Status: AC
  Filled 2024-03-18: qty 100

## 2024-03-18 MED ORDER — PROPOFOL 500 MG/50ML IV EMUL
INTRAVENOUS | Status: DC | PRN
  Administered 2024-03-18: 130 ug/kg/min via INTRAVENOUS

## 2024-03-18 MED ORDER — SODIUM CHLORIDE 0.9 % IV SOLN: INTRAVENOUS | Status: DC

## 2024-03-18 MED ORDER — PROPOFOL 10 MG/ML IV BOLUS
INTRAVENOUS | Status: DC | PRN
  Administered 2024-03-18: 70 mg via INTRAVENOUS

## 2024-03-18 MED ORDER — STERILE WATER FOR IRRIGATION IR SOLN
Status: DC | PRN
  Administered 2024-03-18: 60 mL

## 2024-03-18 NOTE — Op Note (Signed)
 [facility] Patient Name: Jeffrey Scott Procedure Date: 03/18/2024 7:15 AM MRN: [id number] Account #: [id number] Date of Birth: 11-13-1955 Admit Type: Outpatient Age: 68 Room: [facility] ENDO ROOM 3 Gender: Male Note Status: Finalized Instrument Name: Colonoscope [id number] Procedure:             Colonoscopy Indications:           Positive Cologuard test Providers:             Corinn Jess Brooklyn MD, MD Referring MD:          Cheryl CHARLENA Jericho (Referring MD) Medicines:             General Anesthesia Complications:         No immediate complications. Estimated blood loss:                         Minimal. Procedure:             Pre-Anesthesia Assessment:                        - Prior to the procedure, a History and Physical was                         performed, and patient medications and allergies were                         reviewed. The patient is competent. The risks and                         benefits of the procedure and the sedation options and                         risks were discussed with the patient. All questions                         were answered and informed consent was obtained.                         Patient identification and proposed procedure were                         verified by the physician, the nurse, the                         anesthesiologist, the anesthetist and the technician                         in the pre-procedure area in the procedure room in the                         endoscopy suite. Mental Status Examination: alert and                         oriented. Airway Examination: normal oropharyngeal                         airway and neck mobility. Respiratory Examination:  clear to auscultation. CV Examination: normal.                         Prophylactic Antibiotics: The patient does not require                         prophylactic antibiotics. Prior Anticoagulants: The                          patient has taken Plavix  (clopidogrel ), last dose was                         5 days prior to procedure. ASA Grade Assessment: III -                         A patient with severe systemic disease. After                         reviewing the risks and benefits, the patient was                         deemed in satisfactory condition to undergo the                         procedure. The anesthesia plan was to use general                         anesthesia. Immediately prior to administration of                         medications, the patient was re-assessed for adequacy                         to receive sedatives. The heart rate, respiratory                         rate, oxygen saturations, blood pressure, adequacy of                         pulmonary ventilation, and response to care were                         monitored throughout the procedure. The physical                         status of the patient was re-assessed after the                         procedure.                        After obtaining informed consent, the colonoscope was                         passed under direct vision. Throughout the procedure,                         the patient's blood pressure, pulse, and oxygen  saturations were monitored continuously. The                         Colonoscope was introduced through the anus and                         advanced to the the cecum, identified by appendiceal                         orifice and ileocecal valve. The colonoscopy was                         performed without difficulty. The patient tolerated                         the procedure well. The quality of the bowel                         preparation was adequate to identify polyps greater                         than 5 mm in size. The ileocecal valve, appendiceal                         orifice, and rectum were photographed. Findings:      The perianal and digital rectal  examinations were normal. Pertinent       negatives include normal sphincter tone and no palpable rectal lesions.      Skin tags were found on perianal exam.      A 5 mm polyp was found in the cecum. The polyp was sessile. The polyp       was removed with a cold snare. Resection and retrieval were complete.       Estimated blood loss: none.      Five sessile polyps were found in the transverse colon. The polyps were       4 to 6 mm in size. These polyps were removed with a cold snare.       Resection and retrieval were complete. Estimated blood loss: none.      A 10 mm polyp was found in the transverse colon. The polyp was sessile.       The polyp was removed with a hot snare. Resection and retrieval were       complete. To prevent bleeding after the polypectomy, one hemostatic clip       was successfully placed (MR safe). Clip manufacturer: [employer].       There was no bleeding during, or at the end, of the procedure.      Six sessile polyps were found in the descending colon. The polyps were 5       to 6 mm in size. These polyps were removed with a cold snare. Resection       and retrieval were complete.      A 20 mm polyp was found in the rectum. The polyp was flat and sessile.       Preparations were made for mucosal resection. Demarcation of the lesion       was performed with narrow band imaging to clearly identify the       boundaries of the lesion. Eleview was injected to raise the lesion.  Snare mucosal resection was performed. Resection and retrieval were       complete. Resected tissue including tissue margins will be examined by       histology. To prevent bleeding after mucosal resection, four hemostatic       clips were successfully placed (MR safe). Clip manufacturer: [employer]. There was no bleeding during, or at the end, of the       procedure.      Non-bleeding external hemorrhoids were found during retroflexion. The       hemorrhoids were  medium-sized. Impression:            - Perianal skin tags found on perianal exam.                        - One 5 mm polyp in the cecum, removed with a cold                         snare. Resected and retrieved.                        - Five 4 to 6 mm polyps in the transverse colon,                         removed with a cold snare. Resected and retrieved.                        - One 10 mm polyp in the transverse colon, removed                         with a hot snare. Resected and retrieved. Clip                         manufacturer: [employer]. Clip (MR safe) was                         placed.                        - Six 5 to 6 mm polyps in the descending colon,                         removed with a cold snare. Resected and retrieved.                        - One 20 mm polyp in the rectum, removed with mucosal                         resection. Resected and retrieved. Clip manufacturer:                         [employer]. Clips (MR safe) were placed.                        - Non-bleeding external hemorrhoids.                        - Mucosal resection was performed. Resection and  retrieval were complete. Recommendation:        - Discharge patient to home (with escort).                        - Resume previous diet today.                        - Continue present medications.                        - Resume Plavix  (clopidogrel ) in 5 days at prior dose.                         Refer to managing physician for further adjustment of                         therapy.                        - Await pathology results.                        - Repeat colonoscopy in 1 year for surveillance.                        - Refer to a genetics counselor at appointment to be                         scheduled. Procedure Code(s):     --- Professional ---                        [phone], Colonoscopy, flexible; with endoscopic mucosal                         resection                         [id number], 59, Colonoscopy, flexible; with removal of                         tumor(s), polyp(s), or other lesion(s) by snare                         technique Diagnosis Code(s):     --- Professional ---                        D12.0, Benign neoplasm of cecum                        D12.3, Benign neoplasm of transverse colon (hepatic                         flexure or splenic flexure)                        D12.8, Benign neoplasm of rectum                        D12.4, Benign neoplasm of descending colon  R19.5, Other fecal abnormalities                        K64.4, Residual hemorrhoidal skin tags CPT copyright 2022 American Medical Association. All rights reserved. The codes documented in this report are preliminary and upon coder review may  be revised to meet current compliance requirements. Dr. Corinn Brooklyn Corinn Jess Brooklyn MD, MD 03/18/2024 9:06:17 AM This report has been signed electronically. Number of Addenda: 0 Note Initiated On: 03/18/2024 7:15 AM Scope Withdrawal Time: 0 hours 32 minutes 45 seconds  Total Procedure Duration: 0 hours 34 minutes 45 seconds  Estimated Blood Loss:  Estimated blood loss: none.      [facility]

## 2024-03-18 NOTE — Anesthesia Preprocedure Evaluation (Signed)
 Anesthesia Evaluation  Patient identified by MRN, date of birth, ID band Patient awake    Reviewed: Allergy & Precautions, NPO status , Patient's Chart, lab work & pertinent test results  Airway Mallampati: II  TM Distance: >3 FB Neck ROM: Full    Dental  (+) Teeth Intact   Pulmonary neg pulmonary ROS, COPD,  COPD inhaler, Current Smoker and Patient abstained from smoking.   Pulmonary exam normal  + decreased breath sounds      Cardiovascular Exercise Tolerance: Good + CAD and + Peripheral Vascular Disease  negative cardio ROS Normal cardiovascular exam Rhythm:Regular Rate:Normal     Neuro/Psych negative neurological ROS  negative psych ROS   GI/Hepatic negative GI ROS, Neg liver ROS,,,  Endo/Other  negative endocrine ROS  Class 3 obesity  Renal/GU negative Renal ROS  negative genitourinary   Musculoskeletal   Abdominal  (+) + obese  Peds  Hematology negative hematology ROS (+)   Anesthesia Other Findings Past Medical History: No date: COPD (chronic obstructive pulmonary disease) (HCC) No date: History of chicken pox No date: History of measles  Past Surgical History: 1982: EYE SURGERY; Left     Comment:  for Diplopia 12/17/2022: LOWER EXTREMITY ANGIOGRAPHY; Left     Comment:  Procedure: Lower Extremity Angiography;  Surgeon:               Jama Cordella MATSU, MD;  Location: [facility];               Service: Cardiovascular;  Laterality: Left; 2000: NASAL SEPTUM SURGERY     Comment:  [facility] No date: TONSILLECTOMY AND ADENOIDECTOMY  BMI    Body Mass Index: 31.68 kg/m      Reproductive/Obstetrics negative OB ROS                              Anesthesia Physical Anesthesia Plan  ASA: 3  Anesthesia Plan: General   Post-op Pain Management:    Induction: Intravenous  PONV Risk Score and Plan: Propofol  infusion and TIVA  Airway Management Planned: Natural  Airway and Nasal Cannula  Additional Equipment:   Intra-op Plan:   Post-operative Plan:   Informed Consent: I have reviewed the patients History and Physical, chart, labs and discussed the procedure including the risks, benefits and alternatives for the proposed anesthesia with the patient or authorized representative who has indicated his/her understanding and acceptance.     Dental Advisory Given  Plan Discussed with: CRNA  Anesthesia Plan Comments:         Anesthesia Quick Evaluation

## 2024-03-18 NOTE — Transfer of Care (Signed)
 Immediate Anesthesia Transfer of Care Note  Patient: Jeffrey Scott  Procedure(s) Performed: COLONOSCOPY POLYPECTOMY, INTESTINE CONTROL OF HEMORRHAGE, GI TRACT, ENDOSCOPIC, BY CLIPPING OR OVERSEWING  Patient Location: Endoscopy Unit  Anesthesia Type:General  Level of Consciousness: drowsy  Airway & Oxygen Therapy: Patient Spontanous Breathing  Post-op Assessment: Report given to RN  Post vital signs: stable  Last Vitals:  Vitals Value Taken Time  BP    Temp    Pulse    Resp    SpO2      Last Pain:  Vitals:   03/18/24 0739  TempSrc: Temporal  PainSc: 0-No pain         Complications: No notable events documented.

## 2024-03-18 NOTE — H&P (Signed)
 Jeffrey JONELLE Brooklyn, MD [facility], DHIP [address]  [place], [place] [address]  Main: [phone] Fax:  [phone] Pager: [phone]   Primary Care Physician:  Jeffrey Cheryl BRAVO, MD Primary Gastroenterologist:  Dr. Corinn JONELLE Scott  Pre-Procedure History & Physical: HPI:  Jeffrey Scott is a 68 y.o. male is here for an colonoscopy.   Past Medical History:  Diagnosis Date   COPD (chronic obstructive pulmonary disease) (HCC)    History of chicken pox    History of measles     Past Surgical History:  Procedure Laterality Date   EYE SURGERY Left 1982   for Diplopia   LOWER EXTREMITY ANGIOGRAPHY Left 12/17/2022   Procedure: Lower Extremity Angiography;  Surgeon: Jeffrey Cordella MATSU, MD;  Location: [facility] CV [facility];  Service: Cardiovascular;  Laterality: Left;   NASAL SEPTUM SURGERY  2000   [facility]   TONSILLECTOMY AND ADENOIDECTOMY      Prior to Admission medications   Medication Sig Start Date End Date Taking? Authorizing Provider  albuterol (VENTOLIN HFA) 108 (90 Base) MCG/ACT inhaler Inhale into the lungs. 10/31/21  Yes [provider]  oxymetazoline (NASAL RELIEF) 0.05 % nasal spray Place into the nose.   Yes [provider]  STIOLTO RESPIMAT  2.5-2.5 MCG/ACT AERS Inhale 2 puffs into the lungs daily. 08/06/21  Yes Jeffrey Nancyann BRAVO, MD  aspirin  81 MG tablet Take 1 tablet by mouth daily. 06/14/09   [provider]  clopidogrel  (PLAVIX ) 75 MG tablet Take 1 tablet (75 mg total) by mouth daily. 10/17/23   Jeffrey, Fallon E, NP  metoprolol  tartrate (LOPRESSOR ) 100 MG tablet Take 1 tablet (100 mg total) by mouth once for 1 dose. Take 90-120 minutes prior to scan. Hold for SBP less than 110. Patient not taking: Reported on 03/18/2024 03/10/23 04/16/24  Jeffrey Rogue, MD  Oxymetazoline HCl-Menthol 0.05 % SOLN as needed. Patient not taking: Reported on 03/18/2024 09/14/08   [provider]  rosuvastatin  (CRESTOR) 20 MG tablet Take 20 mg by mouth daily.    [provider]    Allergies as of 02/17/2024 - Review Complete 10/17/2023  Allergen Reaction Noted   Niaspan  [niacin er (antihyperlipidemic)]  06/14/2015   Zyban   [bupropion  hcl]  06/14/2015   Zyrtec  [cetirizine]  06/14/2015    Family History  Problem Relation Age of Onset   Diabetes Mother    CAD Mother        s/p PTCA and stents   Hypertension Mother    Dementia Father    Diabetes Father    Hypertension Father     Social History   Socioeconomic History   Marital status: Married    Spouse name: Jeffrey Scott   Number of children: 2   Years of education: Not on file   Highest education level: Not on file  Occupational History   Occupation: Theatre stage manager    Comment: Retired  April 2020  Tobacco Use   Smoking status: Every Day    Current packs/day: 0.50    Average packs/day: 0.5 packs/day for 56.6 years (28.3 ttl pk-yrs)    Types: Cigarettes    Start date: 1969   Smokeless tobacco: Never   Tobacco comments:    smoking up to 1 ppd since age 61  Vaping Use   Vaping status: Never Used  Substance and Sexual Activity   Alcohol use: Yes    Alcohol/week: 0.0 standard drinks of alcohol    Comment: 1 beer  each week   Drug use: No   Sexual activity: Not on file  Other Topics Concern   Not on file  Social History Narrative   Lives with wife, Jeffrey Scott. Outdoor cat.    Social Drivers of Corporate investment banker Strain: Low Risk  (02/16/2024)   Received from [facility]   Overall Financial Resource Strain (CARDIA)    Difficulty of Paying Living Expenses: Not very hard  Food Insecurity: No Food Insecurity (02/16/2024)   Received [facility]   Hunger Vital Sign    Within the past 12 months, you worried that your food would run out before you got the money to buy more.: Never true    Within the past 12 months, the food you bought just didn't last and you didn't have  money to get more.: Never true  Transportation Needs: No Transportation Needs (02/16/2024)   Received from [facility] - Transportation    In the past 12 months, has lack of transportation kept you from medical appointments or from getting medications?: No    Lack of Transportation (Non-Medical): No  Physical Activity: Not on file  Stress: Not on file  Social Connections: Not on file  Intimate Partner Violence: Not on file    Review of Systems: See HPI, otherwise negative ROS  Physical Exam: BP 129/79   Pulse 89   Temp (!) 96.7 F (35.9 C) (Temporal)   Resp 20   Ht 5' 10 (1.778 m)   Wt 100.2 kg   SpO2 97%   BMI 31.68 kg/m  General:   Alert,  pleasant and cooperative in NAD Head:  Normocephalic and atraumatic. Neck:  Supple; no masses or thyromegaly. Lungs:  Clear throughout to auscultation.    Heart:  Regular rate and rhythm. Abdomen:  Soft, nontender and nondistended. Normal bowel sounds, without guarding, and without rebound.   Neurologic:  Alert and  oriented x4;  grossly normal neurologically.  Impression/Plan: Jeffrey Scott is here for an colonoscopy to be performed for cologuard positive  Risks, benefits, limitations, and alternatives regarding  colonoscopy have been reviewed with the patient.  Questions have been answered.  All parties agreeable.   Jeffrey Brooklyn, MD  03/18/2024, 8:17 AM

## 2024-03-18 NOTE — Anesthesia Postprocedure Evaluation (Signed)
 Anesthesia Post Note  Patient: Jeffrey Scott  Procedure(s) Performed: COLONOSCOPY POLYPECTOMY, INTESTINE CONTROL OF HEMORRHAGE, GI TRACT, ENDOSCOPIC, BY CLIPPING OR OVERSEWING  Patient location during evaluation: PACU Anesthesia Type: General Level of consciousness: awake and alert Pain management: satisfactory to patient Vital Signs Assessment: post-procedure vital signs reviewed and stable Respiratory status: spontaneous breathing Cardiovascular status: stable Anesthetic complications: no   No notable events documented.   Last Vitals:  Vitals:   03/18/24 0906 03/18/24 0916  BP: (!) 117/104 118/81  Pulse: 88   Resp: (!) 38 13  Temp: (!) 35.6 C   SpO2: 97% 98%    Last Pain:  Vitals:   03/18/24 0906  TempSrc: Temporal  PainSc: 0-No pain                 VAN STAVEREN,Luvinia Lucy

## 2024-03-19 LAB — SURGICAL PATHOLOGY

## 2024-03-31 ENCOUNTER — Ambulatory Visit: Payer: Self-pay | Admitting: Gastroenterology

## 2024-03-31 NOTE — Progress Notes (Signed)
 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  Margins are free of polyp and there is no submucosal invasion.  Recommend flexible sigmoidoscopy in 6 months followed by surveillance colonoscopy in 3 years  Patient expressed understanding of my recommendations  Tabitha, please schedule flexible sigmoidoscopy in 6 months  Adrijana Haros

## 2024-09-22 ENCOUNTER — Ambulatory Visit: Admission: RE | Admit: 2024-09-22 | Source: Home / Self Care | Admitting: Gastroenterology

## 2024-09-22 ENCOUNTER — Encounter: Admission: RE | Payer: Self-pay | Source: Home / Self Care

## 2024-09-22 SURGERY — SIGMOIDOSCOPY, FLEXIBLE
Anesthesia: General

## 2024-10-15 ENCOUNTER — Ambulatory Visit (INDEPENDENT_AMBULATORY_CARE_PROVIDER_SITE_OTHER): Payer: Medicare Other | Admitting: Nurse Practitioner

## 2024-10-15 ENCOUNTER — Encounter (INDEPENDENT_AMBULATORY_CARE_PROVIDER_SITE_OTHER): Payer: Medicare Other
